# Patient Record
Sex: Male | Born: 1980 | Race: White | Hispanic: No | Marital: Single | Smoking: Never smoker
Health system: Southern US, Community
[De-identification: ages and names within clinical notes are randomized; demographics above are authoritative.]

## PROBLEM LIST (undated history)

## (undated) DIAGNOSIS — G40909 Epilepsy, unspecified, not intractable, without status epilepticus: Secondary | ICD-10-CM

## (undated) DIAGNOSIS — E232 Diabetes insipidus: Secondary | ICD-10-CM

## (undated) DIAGNOSIS — G809 Cerebral palsy, unspecified: Secondary | ICD-10-CM

## (undated) HISTORY — PX: HIP SURGERY: SHX245

## (undated) HISTORY — PX: BACK SURGERY: SHX140

---

## 2018-01-24 ENCOUNTER — Encounter (HOSPITAL_COMMUNITY): Payer: Self-pay | Admitting: Emergency Medicine

## 2018-01-24 ENCOUNTER — Other Ambulatory Visit: Payer: Self-pay

## 2018-01-24 ENCOUNTER — Emergency Department (HOSPITAL_COMMUNITY): Payer: Medicaid Other

## 2018-01-24 ENCOUNTER — Inpatient Hospital Stay (HOSPITAL_COMMUNITY)
Admission: EM | Admit: 2018-01-24 | Discharge: 2018-01-28 | DRG: 871 | Disposition: A | Discharge status: Home Health | Payer: Medicaid Other | Attending: Family Medicine | Admitting: Family Medicine

## 2018-01-24 DIAGNOSIS — M549 Dorsalgia, unspecified: Secondary | ICD-10-CM | POA: Diagnosis present

## 2018-01-24 DIAGNOSIS — G40409 Other generalized epilepsy and epileptic syndromes, not intractable, without status epilepticus: Secondary | ICD-10-CM

## 2018-01-24 DIAGNOSIS — G40909 Epilepsy, unspecified, not intractable, without status epilepticus: Secondary | ICD-10-CM | POA: Diagnosis present

## 2018-01-24 DIAGNOSIS — E872 Acidosis: Secondary | ICD-10-CM | POA: Diagnosis present

## 2018-01-24 DIAGNOSIS — B9620 Unspecified Escherichia coli [E. coli] as the cause of diseases classified elsewhere: Secondary | ICD-10-CM | POA: Diagnosis present

## 2018-01-24 DIAGNOSIS — M25551 Pain in right hip: Secondary | ICD-10-CM | POA: Diagnosis not present

## 2018-01-24 DIAGNOSIS — K5900 Constipation, unspecified: Secondary | ICD-10-CM | POA: Diagnosis present

## 2018-01-24 DIAGNOSIS — N10 Acute tubulo-interstitial nephritis: Secondary | ICD-10-CM | POA: Diagnosis present

## 2018-01-24 DIAGNOSIS — Z79899 Other long term (current) drug therapy: Secondary | ICD-10-CM

## 2018-01-24 DIAGNOSIS — A419 Sepsis, unspecified organism: Principal | ICD-10-CM | POA: Diagnosis present

## 2018-01-24 DIAGNOSIS — N251 Nephrogenic diabetes insipidus: Secondary | ICD-10-CM | POA: Diagnosis present

## 2018-01-24 DIAGNOSIS — M25552 Pain in left hip: Secondary | ICD-10-CM | POA: Diagnosis not present

## 2018-01-24 DIAGNOSIS — N3000 Acute cystitis without hematuria: Secondary | ICD-10-CM | POA: Diagnosis not present

## 2018-01-24 DIAGNOSIS — J181 Lobar pneumonia, unspecified organism: Secondary | ICD-10-CM | POA: Diagnosis present

## 2018-01-24 DIAGNOSIS — M419 Scoliosis, unspecified: Secondary | ICD-10-CM | POA: Diagnosis present

## 2018-01-24 DIAGNOSIS — Z993 Dependence on wheelchair: Secondary | ICD-10-CM | POA: Diagnosis not present

## 2018-01-24 DIAGNOSIS — Z885 Allergy status to narcotic agent status: Secondary | ICD-10-CM | POA: Diagnosis not present

## 2018-01-24 DIAGNOSIS — N390 Urinary tract infection, site not specified: Secondary | ICD-10-CM | POA: Diagnosis present

## 2018-01-24 DIAGNOSIS — R6521 Severe sepsis with septic shock: Secondary | ICD-10-CM | POA: Diagnosis present

## 2018-01-24 DIAGNOSIS — G800 Spastic quadriplegic cerebral palsy: Secondary | ICD-10-CM | POA: Diagnosis present

## 2018-01-24 DIAGNOSIS — D509 Iron deficiency anemia, unspecified: Secondary | ICD-10-CM | POA: Diagnosis present

## 2018-01-24 DIAGNOSIS — E876 Hypokalemia: Secondary | ICD-10-CM | POA: Diagnosis present

## 2018-01-24 DIAGNOSIS — Z888 Allergy status to other drugs, medicaments and biological substances status: Secondary | ICD-10-CM

## 2018-01-24 DIAGNOSIS — J189 Pneumonia, unspecified organism: Secondary | ICD-10-CM

## 2018-01-24 DIAGNOSIS — R7881 Bacteremia: Secondary | ICD-10-CM

## 2018-01-24 DIAGNOSIS — R509 Fever, unspecified: Secondary | ICD-10-CM | POA: Diagnosis not present

## 2018-01-24 HISTORY — DX: Cerebral palsy, unspecified: G80.9

## 2018-01-24 HISTORY — DX: Epilepsy, unspecified, not intractable, without status epilepticus: G40.909

## 2018-01-24 HISTORY — DX: Diabetes insipidus: E23.2

## 2018-01-24 LAB — URINALYSIS, ROUTINE W REFLEX MICROSCOPIC
Bilirubin Urine: NEGATIVE
Glucose, UA: NEGATIVE mg/dL
KETONES UR: 5 mg/dL — AB
Nitrite: NEGATIVE
PH: 5 (ref 5.0–8.0)
PROTEIN: 100 mg/dL — AB
Specific Gravity, Urine: 1.02 (ref 1.005–1.030)

## 2018-01-24 LAB — COMPREHENSIVE METABOLIC PANEL
ALBUMIN: 3.5 g/dL (ref 3.5–5.0)
ALK PHOS: 59 U/L (ref 38–126)
ALT: 19 U/L (ref 0–44)
ANION GAP: 11 (ref 5–15)
AST: 26 U/L (ref 15–41)
BUN: 8 mg/dL (ref 6–20)
CO2: 25 mmol/L (ref 22–32)
Calcium: 9.5 mg/dL (ref 8.9–10.3)
Chloride: 98 mmol/L (ref 98–111)
Creatinine, Ser: 0.86 mg/dL (ref 0.61–1.24)
GFR calc Af Amer: >60 mL/min (ref >60)
GFR calc non Af Amer: >60 mL/min (ref >60)
GLUCOSE: 131 mg/dL — AB (ref 70–99)
Potassium: 3.8 mmol/L (ref 3.5–5.1)
SODIUM: 134 mmol/L — AB (ref 135–145)
Total Bilirubin: 1 mg/dL (ref 0.3–1.2)
Total Protein: 7.2 g/dL (ref 6.5–8.1)

## 2018-01-24 LAB — CBC WITH DIFFERENTIAL/PLATELET
BASOS PCT: 0 %
Basophils Absolute: 0 10*3/uL (ref 0.0–0.1)
Eosinophils Absolute: 0 10*3/uL (ref 0.0–0.5)
Eosinophils Relative: 0 %
HCT: 47.8 % (ref 39.0–52.0)
Hemoglobin: 15.9 g/dL (ref 13.0–17.0)
Lymphocytes Relative: 4 %
Lymphs Abs: 0.8 10*3/uL (ref 0.7–4.0)
MCH: 31.7 pg (ref 26.0–34.0)
MCHC: 33.3 g/dL (ref 30.0–36.0)
MCV: 95.4 fL (ref 80.0–100.0)
MONO ABS: 0.8 10*3/uL (ref 0.1–1.0)
Monocytes Relative: 4 %
NEUTROS ABS: 18.7 10*3/uL — AB (ref 1.7–7.7)
NEUTROS PCT: 92 %
NRBC: 0 /100{WBCs}
PLATELETS: 146 10*3/uL — AB (ref 150–400)
RBC: 5.01 MIL/uL (ref 4.22–5.81)
RDW: 13.6 % (ref 11.5–15.5)
WBC: 20.3 10*3/uL — AB (ref 4.0–10.5)
nRBC: 0 % (ref 0.0–0.2)

## 2018-01-24 LAB — I-STAT CG4 LACTIC ACID, ED
LACTIC ACID, VENOUS: 2.13 mmol/L — AB (ref 0.5–1.9)
Lactic Acid, Venous: 3.69 mmol/L (ref 0.5–1.9)

## 2018-01-24 LAB — I-STAT CHEM 8, ED
BUN: 9 mg/dL (ref 6–20)
Calcium, Ion: 1.13 mmol/L — ABNORMAL LOW (ref 1.15–1.40)
Chloride: 98 mmol/L (ref 98–111)
Creatinine, Ser: 0.8 mg/dL (ref 0.61–1.24)
Glucose, Bld: 131 mg/dL — ABNORMAL HIGH (ref 70–99)
HCT: 49 % (ref 39.0–52.0)
HEMOGLOBIN: 16.7 g/dL (ref 13.0–17.0)
POTASSIUM: 3.9 mmol/L (ref 3.5–5.1)
SODIUM: 136 mmol/L (ref 135–145)
TCO2: 32 mmol/L (ref 22–32)

## 2018-01-24 LAB — I-STAT VENOUS BLOOD GAS, ED
Acid-Base Excess: 3 mmol/L — ABNORMAL HIGH (ref 0.0–2.0)
BICARBONATE: 27.7 mmol/L (ref 20.0–28.0)
O2 Saturation: 79 %
PCO2 VEN: 41.9 mmHg — AB (ref 44.0–60.0)
PH VEN: 7.428 (ref 7.250–7.430)
PO2 VEN: 42 mmHg (ref 32.0–45.0)
TCO2: 29 mmol/L (ref 22–32)

## 2018-01-24 LAB — PROTIME-INR
INR: 1.18
Prothrombin Time: 14.9 seconds (ref 11.4–15.2)

## 2018-01-24 MED ORDER — LEVETIRACETAM ER 500 MG PO TB24: 500.0000 mg | ORAL_TABLET | Freq: Every day | ORAL | Status: DC

## 2018-01-24 MED ORDER — POLYETHYLENE GLYCOL 3350 17 G PO PACK: 17.0000 g | PACK | Freq: Every day | ORAL | Status: DC

## 2018-01-24 MED ORDER — LEVETIRACETAM ER 500 MG PO TB24
500.0000 mg | ORAL_TABLET | Freq: Two times a day (BID) | ORAL | Status: DC
  Filled 2018-01-24: qty 1

## 2018-01-24 MED ORDER — BISACODYL 10 MG RE SUPP: 10.0000 mg | Freq: Every day | RECTAL | Status: DC | PRN

## 2018-01-24 MED ORDER — SODIUM CHLORIDE 0.9 % IV SOLN
500.0000 mg | Freq: Once | INTRAVENOUS | Status: AC
  Administered 2018-01-24: 500 mg via INTRAVENOUS
  Filled 2018-01-24: qty 500

## 2018-01-24 MED ORDER — DIAZEPAM 5 MG PO TABS
5.0000 mg | ORAL_TABLET | Freq: Every day | ORAL | Status: DC | PRN
  Administered 2018-01-25 – 2018-01-27 (×2): 5 mg via ORAL
  Filled 2018-01-24 (×2): qty 1

## 2018-01-24 MED ORDER — LACTATED RINGERS IV SOLN
INTRAVENOUS | Status: DC
  Administered 2018-01-24 – 2018-01-28 (×5): via INTRAVENOUS

## 2018-01-24 MED ORDER — ACETAMINOPHEN 325 MG PO TABS
650.0000 mg | ORAL_TABLET | Freq: Once | ORAL | Status: AC
  Administered 2018-01-24: 650 mg via ORAL
  Filled 2018-01-24: qty 2

## 2018-01-24 MED ORDER — KETOROLAC TROMETHAMINE 15 MG/ML IJ SOLN
15.0000 mg | Freq: Once | INTRAMUSCULAR | Status: AC
  Administered 2018-01-24: 15 mg via INTRAVENOUS
  Filled 2018-01-24: qty 1

## 2018-01-24 MED ORDER — SODIUM CHLORIDE 0.9 % IV SOLN
1.0000 g | Freq: Once | INTRAVENOUS | Status: AC
  Administered 2018-01-24: 1 g via INTRAVENOUS
  Filled 2018-01-24: qty 10

## 2018-01-24 MED ORDER — IOHEXOL 300 MG/ML  SOLN
80.0000 mL | Freq: Once | INTRAMUSCULAR | Status: AC | PRN
  Administered 2018-01-24: 100 mL via INTRAVENOUS

## 2018-01-24 MED ORDER — SODIUM CHLORIDE 0.9 % IV BOLUS
1000.0000 mL | Freq: Once | INTRAVENOUS | Status: AC
  Administered 2018-01-24: 1000 mL via INTRAVENOUS

## 2018-01-24 MED ORDER — FLEET ENEMA 7-19 GM/118ML RE ENEM
1.0000 | ENEMA | Freq: Once | RECTAL | Status: DC
  Filled 2018-01-24: qty 1

## 2018-01-24 MED ORDER — DIVALPROEX SODIUM ER 500 MG PO TB24
1500.0000 mg | ORAL_TABLET | Freq: Every day | ORAL | Status: DC
  Administered 2018-01-25 – 2018-01-27 (×4): 1500 mg via ORAL
  Filled 2018-01-24 (×5): qty 3

## 2018-01-24 MED ORDER — LEVETIRACETAM ER 500 MG PO TB24
3000.0000 mg | ORAL_TABLET | Freq: Every day | ORAL | Status: DC
  Administered 2018-01-25 – 2018-01-27 (×4): 3000 mg via ORAL
  Filled 2018-01-24 (×4): qty 6

## 2018-01-24 NOTE — ED Triage Notes (Signed)
Pt has cerebral palsy.  Recently moved here from [facility]- PCP is [facility] in [place]..  Mom reports fever since yesterday (Tmax 101.8 axillary just PTA), congestion today, and constipation x 3 days.  Also reports foul smelling urine. Mom administered Tylenol with codeine PTA.

## 2018-01-24 NOTE — H&P (Addendum)
Family Medicine Teaching [facility] Admission History and Physical Service Pager: [phone]  Patient name: Jacob Wilkerson Medical record number: [id number] Date of birth: 1980/07/08 Age: 37 y.o. Gender: male  Primary Care Provider: Lazarus Gowda, MD Consultants: None Code Status: Full   Chief Complaint: Fever  Assessment and Plan: Jacob Wilkerson is a 37 y.o. male presenting with fever, cough, and UTI. 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   Sepsis 2/2 UTI, Pneumonia With 2 day h/o of fever and increased urinary frequency. Presented meeting sepsis shock criteria with tachycardia to 150s, fever 100.6F, RR 27, lactic acidosis 3.69, and hypotension with fluid resuscitation. He is s/p 2L NS bolus in the ED with some response in BP, 95/70 at the time of exam, however DP pulses appreciated on exam with good cap refill. Last PCP note recorded normal vitals (118/78, 76 HR). U/A notable for large leuks, large Hb, many bacteria, negative nitrites. Blood and urine cultures obtained in the ED prior to starting CTX. WBC 20.3. No hydronephrosis seen on CT. Patient also with cough since this morning. CXR obtained in the ED with mild vascular congestion however without focal infiltrates. R hemidiaphragm also elevated, question atelectasis given rhonchorous sounds heard on exam in R middle and lower lobes. No prior CXR for comparison. CT Abd/Pelvis showed RLL consolidation concerning for pneumonia as well as ground glass densities in the LLL. Started on azithromycin in addition to CTX. Has previously received pneumococcal vaccine per last PCP note. - admit to med-surg, attending Dr. Jennette Kettle - continue CTX and azithromycin (10/20 - ) - am CBC, BMP - strict I&Os - f/u urine, blood cx - vital signs per unit routine - trend LA  Constipation  Abdominal Pain Has tried glycerin suppository and fleet enema at home yesterday without  success. Mom reports h/o hard stools with BM occurring ~2 days. CT Abd/Pelvis obtained showing large stool burden in the colon concerning for impaction with gaseous distention without evidence of obstruction. Manual disimpaction attempted at home as well as in the ED with minimal success. Enema ordered. No prior abdominal surgeries to concern for adhesions.  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 Takes Valium 5mg  once Jacob PRN for muscle spasms.  - continue PRN Valium  Nephrogenic Diabetes insipidus Na 134 on admission. S/p 2L NS bolus with increase in Na to 136. Continued on LR for maintenance IVF. Given propensity for hypernatremia, will obtain repeat BMP to reassess need for change in IVF. - repeat BMP, consider switching to 1/4NS if needed - continue LR mIVF - am BMP  Epilepsy  Followed by [facility] neurology, Dr. Malvin Johns. H/o seizures since birth, home depakote ER 1500mg  and keppra XR 3000mg  nightly. EEG recently obtained 10/11, results not viewable. Last seizure was last week per mom due to medication changes after frequent seizures. She states he has not had any seizures since switching back to XR formulation of Keppra. - continue home Keppra and Depakote - seizure precautions  Back pain Severe thoracolumbar scoliosis with h/o several back surgeries. Takes Tylenol #3 TID PRN for pain - hold home Tylenol #3  FEN/GI: Heart Healthy Prophylaxis: lovenox  Disposition: admit to med-surg, attending Dr. Jennette Kettle  History of Present Illness:  Jacob Wilkerson is a 37 y.o. male presenting with fever.   History obtained from  patient's mother who was present at bedside.   Per patient's mother patient has been "sick" a couple of days now. Patient began running fever yesterday with Tmax 101, but fever broke this morning. Patient then began spiking  another fever this afternoon with Tmax today 102.8. Along with fever, patient began having foul smelling urine yesterday evening. Patient does not seem to be in pain during urination. Mother notes volume of urination has increased. Patient also with cough beginning this morning. Father was concerned he had pneumonia. No recent wounds. No sick contacts. Does live with his sister and their kids since moving from [place] in May.  Patient also with 3-4 day history of constipation. Normally has BM every 2 days. At home patient takes Mg with drinks and has regular BMs. Has not had enema in years. Mother tried glycerin suppository with fleet enema yesterday and oral Mg with no BM at home.  No leakage of fluid from rectum. Patient has always had issues with bowels but no h/o obstruction or perforation. No blood in urine or stool. No abdominal surgeries.  H/o CP, nonverbal at baseline with mental deficiencies. Has had several back surgeries. Recently moved from [place] state in May. Mother concerned that maybe climate change has caused illness. Patient has been doing well since move.   Has nephrogenic diabetes insipidus. Normally has elevated sodium. Recent change in diet has limited sodium intake. Mom states he is not supposed to have normal saline with IV fluids.   H/o respiratory distress when under anesthesia.   Review Of Systems: Per HPI with the following additions:  ROS from patient's mother  Review of Systems  Constitutional: Positive for fever. Negative for weight loss.  Respiratory: Positive for cough.   Gastrointestinal: Positive for constipation.  Genitourinary: Negative for dysuria, frequency and hematuria.       Urinary odor Increased urine volume   Patient Active Problem List   Diagnosis Date Noted  . Urinary tract infection 01/24/2018   Past Medical History: Past Medical History:  Diagnosis Date  . Cerebral palsy (HCC)   . Diabetes insipidus (HCC)   . Epilepsy [facility])    Past  Surgical History: Past Surgical History:  Procedure Laterality Date  . BACK SURGERY    . HIP SURGERY     Social History: Social History   Tobacco Use  . Smoking status: Never Smoker  . Smokeless tobacco: Never Used  Substance Use Topics  . Alcohol use: Never    Frequency: Never  . Drug use: Never   Additional social history: lives with mother, father, sister, and sister's children   Please also refer to relevant sections of EMR.  Family History: No family history on file. Mother - asthma Sister - asthma Nephew - asthma  Unknown biological father history  Maternal grandmother - stroke   Allergies and Medications: Allergies  Allergen Reactions  . Morphine Anaphylaxis  . Topiramate Other (See Comments)    Got into a coma With valium causes and overdose. And coma   . Orange (Diagnostic) Nausea And Vomiting  . Tramadol Other (See Comments)    Can't take due to his seizures SEIZURE DISORDER CAN NOT TAKE PER NEUROLOGIST.     No current facility-administered medications on file prior to encounter.    Current Outpatient Medications on File Prior to Encounter  Medication Sig Dispense Refill  . acetaminophen-codeine (TYLENOL #3) 300-30 MG tablet Take 1 tablet by mouth every 6 (six) hours as needed. for pain  0  . diazepam (VALIUM)  5 MG tablet Take 5 mg by mouth every 12 (twelve) hours as needed. for anxiety  0  . divalproex (DEPAKOTE ER) 500 MG 24 hr tablet Take 1,500 mg by mouth at bedtime.    . Levetiracetam 750 MG TB24 Take 3,000 mg by mouth at bedtime.  2    Objective: BP (!) 94/58   Pulse (!) 110   Temp (!) 100.5 F (38.1 C) (Axillary)   Resp 19   Ht 5' (1.524 m)   Wt 47.8 kg   SpO2 96%   BMI 20.58 kg/m  Exam: General: chronically ill male lying in bed, intermittent coughing on exam, in NAD Eyes: PERRL, EOMI ENTM: poor dentition, MMM Neck: no cervical lymphadenopathy Cardiovascular: distant heart sounds, tachycardic. DP pulses appreciated Respiratory:  CTAB, rhonchorous sounds appreciated R middle to lower lobes. Normal WOB on RA, appropriately saturated. Gastrointestinal: chronic spasticity noted although soft to palpation. No grimace noted on palpation. No hepatosplenomegaly. +BS in all 4 quadrants. MSK: chronic spasticity in all 4 extremities Derm: no lesions or wounds identified Neuro: non-verbal at baseline, orients to sound. Per mom is at neurological baseline.  Labs and Imaging: CBC BMET  Recent Labs  Lab 01/24/18 1841 01/24/18 1900  WBC 20.3*  --   HGB 15.9 16.7  HCT 47.8 49.0  PLT 146*  --    Recent Labs  Lab 01/24/18 1841 01/24/18 1900  NA 134* 136  K 3.8 3.9  CL 98 98  CO2 25  --   BUN 8 9  CREATININE 0.86 0.80  GLUCOSE 131* 131*  CALCIUM 9.5  --       Ref. Range 01/24/2018 19:00 01/24/2018 20:48  Lactic Acid, Venous Latest Ref Range: 0.5 - 1.9 mmol/L 3.69 (HH) 2.13 (HH)    Ref. Range 01/24/2018 19:02  Sample type Unknown VENOUS  pH, Ven Latest Ref Range: 7.250 - 7.430  7.428  pCO2, Ven Latest Ref Range: 44.0 - 60.0 mmHg 41.9 (L)  pO2, Ven Latest Ref Range: 32.0 - 45.0 mmHg 42.0  TCO2 Latest Ref Range: 22 - 32 mmol/L 29  Acid-Base Excess Latest Ref Range: 0.0 - 2.0 mmol/L 3.0 (H)  Bicarbonate Latest Ref Range: 20.0 - 28.0 mmol/L 27.7  O2 Saturation Latest Units: % 79.0   Urinalysis    Component Value Date/Time   COLORURINE AMBER (A) 01/24/2018 1912   APPEARANCEUR CLOUDY (A) 01/24/2018 1912   LABSPEC 1.020 01/24/2018 1912   PHURINE 5.0 01/24/2018 1912   GLUCOSEU NEGATIVE 01/24/2018 1912   HGBUR LARGE (A) 01/24/2018 1912   BILIRUBINUR NEGATIVE 01/24/2018 1912   KETONESUR 5 (A) 01/24/2018 1912   PROTEINUR 100 (A) 01/24/2018 1912   NITRITE NEGATIVE 01/24/2018 1912   LEUKOCYTESUR LARGE (A) 01/24/2018 1912    Ref. Range 01/24/2018 19:12  Bacteria, UA Latest Ref Range: NONE SEEN  MANY (A)  Mucus Unknown PRESENT  RBC / HPF Latest Ref Range: 0 - 5 RBC/hpf 6-10  Squamous Epithelial / LPF Latest Ref  Range: 0 - 5  0-5  WBC Clumps Unknown PRESENT  WBC, UA Latest Ref Range: 0 - 5 WBC/hpf >50 (H)    Ct Abdomen Pelvis W Contrast  Result Date: 01/24/2018 CLINICAL DATA:  Lower abdominal pain. EXAM: CT ABDOMEN AND PELVIS WITH CONTRAST TECHNIQUE: Multidetector CT imaging of the abdomen and pelvis was performed using the standard protocol following bolus administration of intravenous contrast. CONTRAST:  OMNIPAQUE IOHEXOL 300 MG/ML  SOLN COMPARISON:  None. FINDINGS: Lower chest: Consolidation in the right lower lobe concerning  for pneumonia. Patchy ground-glass opacities in the left lower lobe could also reflect pneumonia. No effusions. Hepatobiliary: No focal hepatic abnormality. Gallbladder unremarkable. Pancreas: No focal abnormality or ductal dilatation. Spleen: No focal abnormality.  Normal size. Adrenals/Urinary Tract: Small cyst in the midpole of the left kidney and upper pole of the left kidney. No hydronephrosis. No adrenal mass. Urinary bladder decompressed and difficult to evaluate. Stomach/Bowel: Large stool burden in the rectosigmoid colon concerning for fecal impaction. Large stool burden throughout the remainder of the colon. Gas throughout stomach, large and small bowel. No convincing evidence for bowel obstruction. Vascular/Lymphatic: No evidence of aneurysm or adenopathy. Reproductive: No visible focal abnormality. Other: No free fluid or free air. Musculoskeletal: Severe thoracolumbar scoliosis with posterior spinal rods in place. No acute bony abnormality. IMPRESSION: Airspace disease in the posterior right lower lobe and to a lesser extent left lower lobe concerning for pneumonia. Large stool burden in the colon, particularly rectosigmoid colon concerning for fecal impaction. Mild gaseous distention of bowel diffusely without evidence of bowel obstruction. Severe thoracolumbar scoliosis. Electronically Signed   By: Charlett Nose M.D.   On: 01/24/2018 20:07   Dg Chest Port 1  View  Result Date: 01/24/2018 CLINICAL DATA:  Cough, hypoxia EXAM: PORTABLE CHEST 1 VIEW COMPARISON:  None FINDINGS: Elevation of the right hemidiaphragm. Mild vascular congestion. No confluent opacities or effusions. Heart is normal size. Posterior spinal hardware noted. Gaseous distention of upper abdominal bowel. IMPRESSION: Elevation of the right hemidiaphragm.  Mild vascular congestion. Electronically Signed   By: Charlett Nose M.D.   On: 01/24/2018 19:10   Ellwood Dense, DO 01/24/2018, 10:59 PM PGY-2, [facility] FPTS Intern pager: [phone], text pages welcome

## 2018-01-24 NOTE — ED Provider Notes (Signed)
[facility] EMERGENCY DEPARTMENT Provider Note   CSN: [id number] Arrival date & time: 01/24/18  1804     History   Chief Complaint Chief Complaint  Patient presents with  . Fever  . Constipation    HPI Dannis Deroche is a 37 y.o. male.  37 yo M with a cc of fever.  Going on for the past couple days.  Patient has a history of cerebral palsy and is normally fairly healthy according to the family.  Has had a mild cough and constipation.  No bowel movement in about 3 days.  They have tried milk of magnesia an enema  The history is provided by the patient.  Fever   This is a new problem. The current episode started 2 days ago. The problem occurs constantly. The problem has been gradually worsening. His temperature was unmeasured prior to arrival. Pertinent negatives include no chest pain, no diarrhea, no vomiting, no congestion and no headaches. He has tried nothing for the symptoms. The treatment provided no relief.  Constipation   Associated symptoms include abdominal pain.    Past Medical History:  Diagnosis Date  . Cerebral palsy (HCC)   . Diabetes insipidus (HCC)   . Epilepsy (HCC)     There are no active problems to display for this patient.   Past Surgical History:  Procedure Laterality Date  . BACK SURGERY    . HIP SURGERY          Home Medications    Prior to Admission medications   Not on File    Family History No family history on file.  Social History Social History   Tobacco Use  . Smoking status: Never Smoker  . Smokeless tobacco: Never Used  Substance Use Topics  . Alcohol use: Never    Frequency: Never  . Drug use: Never     Allergies   Patient has no allergy information on record.   Review of Systems Review of Systems  Constitutional: Negative for chills and fever.  HENT: Negative for congestion and facial swelling.   Eyes: Negative for discharge and visual disturbance.  Respiratory: Negative for shortness of  breath.   Cardiovascular: Negative for chest pain and palpitations.  Gastrointestinal: Positive for abdominal pain and constipation. Negative for diarrhea and vomiting.  Musculoskeletal: Negative for arthralgias and myalgias.  Skin: Negative for color change and rash.  Neurological: Negative for tremors, syncope and headaches.  Psychiatric/Behavioral: Negative for confusion and dysphoric mood.     Physical Exam Updated Vital Signs BP 95/65   Pulse (!) 101   Temp (!) 100.5 F (38.1 C) (Axillary)   Resp 16   Ht 5' (1.524 m)   Wt 47.8 kg   SpO2 99%   BMI 20.58 kg/m   Physical Exam  Constitutional: He is oriented to person, place, and time. He appears well-developed and well-nourished.  HENT:  Head: Normocephalic and atraumatic.  Eyes: Pupils are equal, round, and reactive to light. EOM are normal.  Neck: Normal range of motion. Neck supple. No JVD present.  Cardiovascular: Regular rhythm. Tachycardia present. Exam reveals no gallop and no friction rub.  No murmur heard. Pulmonary/Chest: No respiratory distress. He has no wheezes.  Abdominal: He exhibits no distension and no mass. There is tenderness. There is no rebound and no guarding.  Patient appears to grimace when I palpate his lower abdomen worst on the right side  Genitourinary:  Genitourinary Comments: Trace soft brown stool in the vault.  Musculoskeletal:  Contractures lower extremities.  Shortened right lower extremity.  Neurological: He is alert and oriented to person, place, and time.  Skin: No rash noted. No pallor.  Psychiatric: He has a normal mood and affect. His behavior is normal.  Nursing note and vitals reviewed.    ED Treatments / Results  Labs (all labs ordered are listed, but only abnormal results are displayed) Labs Reviewed  COMPREHENSIVE METABOLIC PANEL - Abnormal; Notable for the following components:      Result Value   Sodium 134 (*)    Glucose, Bld 131 (*)    All other components within  normal limits  CBC WITH DIFFERENTIAL/PLATELET - Abnormal; Notable for the following components:   WBC 20.3 (*)    Platelets 146 (*)    Neutro Abs 18.7 (*)    All other components within normal limits  URINALYSIS, ROUTINE W REFLEX MICROSCOPIC - Abnormal; Notable for the following components:   Color, Urine AMBER (*)    APPearance CLOUDY (*)    Hgb urine dipstick LARGE (*)    Ketones, ur 5 (*)    Protein, ur 100 (*)    Leukocytes, UA LARGE (*)    WBC, UA >50 (*)    Bacteria, UA MANY (*)    All other components within normal limits  I-STAT CG4 LACTIC ACID, ED - Abnormal; Notable for the following components:   Lactic Acid, Venous 3.69 (*)    All other components within normal limits  I-STAT CHEM 8, ED - Abnormal; Notable for the following components:   Glucose, Bld 131 (*)    Calcium, Ion 1.13 (*)    All other components within normal limits  I-STAT VENOUS BLOOD GAS, ED - Abnormal; Notable for the following components:   pCO2, Ven 41.9 (*)    Acid-Base Excess 3.0 (*)    All other components within normal limits  I-STAT CG4 LACTIC ACID, ED - Abnormal; Notable for the following components:   Lactic Acid, Venous 2.13 (*)    All other components within normal limits  CULTURE, BLOOD (ROUTINE X 2)  CULTURE, BLOOD (ROUTINE X 2)  PROTIME-INR    EKG EKG Interpretation  Date/Time:  Sunday January 24 2018 18:18:03 EDT Ventricular Rate:  151 PR Interval:  152 QRS Duration: 68 QT Interval:  230 QTC Calculation: 364 R Axis:   129 Text Interpretation:  Sinus tachycardia Anterolateral infarct , age undetermined Abnormal ECG st changes likely rate related Otherwise no significant change Confirmed by Melene Plan [phone]) on 01/24/2018 6:22:22 PM   Radiology Ct Abdomen Pelvis W Contrast  Result Date: 01/24/2018 CLINICAL DATA:  Lower abdominal pain. EXAM: CT ABDOMEN AND PELVIS WITH CONTRAST TECHNIQUE: Multidetector CT imaging of the abdomen and pelvis was performed using the standard  protocol following bolus administration of intravenous contrast. CONTRAST:  OMNIPAQUE IOHEXOL 300 MG/ML  SOLN COMPARISON:  None. FINDINGS: Lower chest: Consolidation in the right lower lobe concerning for pneumonia. Patchy ground-glass opacities in the left lower lobe could also reflect pneumonia. No effusions. Hepatobiliary: No focal hepatic abnormality. Gallbladder unremarkable. Pancreas: No focal abnormality or ductal dilatation. Spleen: No focal abnormality.  Normal size. Adrenals/Urinary Tract: Small cyst in the midpole of the left kidney and upper pole of the left kidney. No hydronephrosis. No adrenal mass. Urinary bladder decompressed and difficult to evaluate. Stomach/Bowel: Large stool burden in the rectosigmoid colon concerning for fecal impaction. Large stool burden throughout the remainder of the colon. Gas throughout stomach, large and small bowel. No convincing evidence for  bowel obstruction. Vascular/Lymphatic: No evidence of aneurysm or adenopathy. Reproductive: No visible focal abnormality. Other: No free fluid or free air. Musculoskeletal: Severe thoracolumbar scoliosis with posterior spinal rods in place. No acute bony abnormality. IMPRESSION: Airspace disease in the posterior right lower lobe and to a lesser extent left lower lobe concerning for pneumonia. Large stool burden in the colon, particularly rectosigmoid colon concerning for fecal impaction. Mild gaseous distention of bowel diffusely without evidence of bowel obstruction. Severe thoracolumbar scoliosis. Electronically Signed   By: Charlett Nose M.D.   On: 01/24/2018 20:07   Dg Chest Port 1 View  Result Date: 01/24/2018 CLINICAL DATA:  Cough, hypoxia EXAM: PORTABLE CHEST 1 VIEW COMPARISON:  None FINDINGS: Elevation of the right hemidiaphragm. Mild vascular congestion. No confluent opacities or effusions. Heart is normal size. Posterior spinal hardware noted. Gaseous distention of upper abdominal bowel. IMPRESSION: Elevation of  the right hemidiaphragm.  Mild vascular congestion. Electronically Signed   By: Charlett Nose M.D.   On: 01/24/2018 19:10    Procedures Procedures (including critical care time)  Medications Ordered in ED Medications  lactated ringers infusion ( Intravenous New Bag/Given 01/24/18 2045)  azithromycin (ZITHROMAX) 500 mg in sodium chloride 0.9 % 250 mL IVPB (500 mg Intravenous New Bag/Given 01/24/18 2057)  sodium phosphate (FLEET) 7-19 GM/118ML enema 1 enema (has no administration in time range)  sodium chloride 0.9 % bolus 1,000 mL (1,000 mLs Intravenous New Bag/Given 01/24/18 2045)  ketorolac (TORADOL) 15 MG/ML injection 15 mg (15 mg Intravenous Given 01/24/18 1852)  acetaminophen (TYLENOL) tablet 650 mg (650 mg Oral Given 01/24/18 1852)  sodium chloride 0.9 % bolus 1,000 mL (0 mLs Intravenous Stopped 01/24/18 2017)  iohexol (OMNIPAQUE) 300 MG/ML solution 80 mL (100 mLs Intravenous Contrast Given 01/24/18 1938)  cefTRIAXone (ROCEPHIN) 1 g in sodium chloride 0.9 % 100 mL IVPB (0 g Intravenous Stopped 01/24/18 2054)     Initial Impression / Assessment and Plan / ED Course  I have reviewed the triage vital signs and the nursing notes.  Pertinent labs & imaging results that were available during my care of the patient were reviewed by me and considered in my medical decision making (see chart for details).     37 yo M with a cc of a fever.  Going on for the past couple days. Patient non verbal.  Abdominal pain on my exam, will ct labs, ua, cxr.   HR significantly improved after IV fluids.  The patient's blood pressure trended down into the 70s for about 10 minutes and then came back up with more IV fluids.  Labs with a lactate of 3.7, sodium is mildly low at 134.  He has a white blood cell count of 20.3.  His urine was concerning for a urinary tract infection with large leuks and nitrate positive.  Started on Rocephin.  CT scan of the abdomen pelvis with large stool burden, appears to have a  large fecal impaction.  Clinically I am unable to reach the stool.  We will give an enema.  Will discuss with hospitalist for admission.  CRITICAL CARE Performed by: Rae Roam   Total critical care time: 80 minutes  Critical care time was exclusive of separately billable procedures and treating other patients.  Critical care was necessary to treat or prevent imminent or life-threatening deterioration.  Critical care was time spent personally by me on the following activities: development of treatment plan with patient and/or surrogate as well as nursing, discussions with consultants, evaluation of  patient's response to treatment, examination of patient, obtaining history from patient or surrogate, ordering and performing treatments and interventions, ordering and review of laboratory studies, ordering and review of radiographic studies, pulse oximetry and re-evaluation of patient's condition.   Final Clinical Impressions(s) / ED Diagnoses   Final diagnoses:  Septic shock (HCC)  Acute pyelonephritis  Community acquired pneumonia of left lower lobe of lung [facility])    ED Discharge Orders    None       Melene Plan, DO 01/24/18 2110

## 2018-01-24 NOTE — ED Notes (Signed)
MD made aware of BP

## 2018-01-24 NOTE — ED Notes (Signed)
No answer in waiting room 

## 2018-01-25 DIAGNOSIS — N10 Acute tubulo-interstitial nephritis: Secondary | ICD-10-CM

## 2018-01-25 DIAGNOSIS — K5900 Constipation, unspecified: Secondary | ICD-10-CM

## 2018-01-25 LAB — BASIC METABOLIC PANEL
Anion gap: 6 (ref 5–15)
BUN: 9 mg/dL (ref 6–20)
CALCIUM: 8.1 mg/dL — AB (ref 8.9–10.3)
CO2: 26 mmol/L (ref 22–32)
CREATININE: 0.7 mg/dL (ref 0.61–1.24)
Chloride: 103 mmol/L (ref 98–111)
GFR calc Af Amer: >60 mL/min (ref >60)
GLUCOSE: 112 mg/dL — AB (ref 70–99)
Potassium: 3.4 mmol/L — ABNORMAL LOW (ref 3.5–5.1)
Sodium: 135 mmol/L (ref 135–145)

## 2018-01-25 LAB — BLOOD CULTURE ID PANEL (REFLEXED)
Acinetobacter baumannii: NOT DETECTED
CANDIDA PARAPSILOSIS: NOT DETECTED
CANDIDA TROPICALIS: NOT DETECTED
CARBAPENEM RESISTANCE: NOT DETECTED
Candida albicans: NOT DETECTED
Candida glabrata: NOT DETECTED
Candida krusei: NOT DETECTED
ENTEROBACTER CLOACAE COMPLEX: NOT DETECTED
ENTEROBACTERIACEAE SPECIES: DETECTED — AB
Enterococcus species: NOT DETECTED
Escherichia coli: DETECTED — AB
HAEMOPHILUS INFLUENZAE: NOT DETECTED
KLEBSIELLA PNEUMONIAE: NOT DETECTED
Klebsiella oxytoca: NOT DETECTED
Listeria monocytogenes: NOT DETECTED
NEISSERIA MENINGITIDIS: NOT DETECTED
PROTEUS SPECIES: NOT DETECTED
Pseudomonas aeruginosa: NOT DETECTED
STAPHYLOCOCCUS AUREUS BCID: NOT DETECTED
STREPTOCOCCUS SPECIES: NOT DETECTED
Serratia marcescens: NOT DETECTED
Staphylococcus species: NOT DETECTED
Streptococcus agalactiae: NOT DETECTED
Streptococcus pneumoniae: NOT DETECTED
Streptococcus pyogenes: NOT DETECTED

## 2018-01-25 LAB — LACTIC ACID, PLASMA
LACTIC ACID, VENOUS: 1.9 mmol/L (ref 0.5–1.9)
LACTIC ACID, VENOUS: 1.8 mmol/L (ref 0.5–1.9)
Lactic Acid, Venous: 2.3 mmol/L (ref 0.5–1.9)

## 2018-01-25 LAB — CBC
HEMATOCRIT: 34.9 % — AB (ref 39.0–52.0)
Hemoglobin: 11.6 g/dL — ABNORMAL LOW (ref 13.0–17.0)
MCH: 31.8 pg (ref 26.0–34.0)
MCHC: 33.2 g/dL (ref 30.0–36.0)
MCV: 95.6 fL (ref 80.0–100.0)
PLATELETS: 119 10*3/uL — AB (ref 150–400)
RBC: 3.65 MIL/uL — ABNORMAL LOW (ref 4.22–5.81)
RDW: 13.5 % (ref 11.5–15.5)
WBC: 14.9 10*3/uL — ABNORMAL HIGH (ref 4.0–10.5)
nRBC: 0 % (ref 0.0–0.2)

## 2018-01-25 LAB — HIV ANTIBODY (ROUTINE TESTING W REFLEX): HIV Screen 4th Generation wRfx: NONREACTIVE

## 2018-01-25 MED ORDER — ACETAMINOPHEN-CODEINE #3 300-30 MG PO TABS
1.0000 | ORAL_TABLET | Freq: Four times a day (QID) | ORAL | Status: DC | PRN
  Administered 2018-01-25 – 2018-01-27 (×4): 1 via ORAL
  Filled 2018-01-25 (×5): qty 1

## 2018-01-25 MED ORDER — POLYETHYLENE GLYCOL 3350 17 G PO PACK
17.0000 g | PACK | Freq: Two times a day (BID) | ORAL | Status: DC
  Administered 2018-01-25 – 2018-01-28 (×7): 17 g via ORAL
  Filled 2018-01-25 (×8): qty 1

## 2018-01-25 MED ORDER — SORBITOL 70 % SOLN
960.0000 mL | TOPICAL_OIL | Freq: Once | ORAL | Status: DC
  Filled 2018-01-25: qty 473

## 2018-01-25 MED ORDER — POTASSIUM CHLORIDE CRYS ER 20 MEQ PO TBCR
20.0000 meq | EXTENDED_RELEASE_TABLET | Freq: Once | ORAL | Status: AC
  Administered 2018-01-25: 20 meq via ORAL
  Filled 2018-01-25: qty 1

## 2018-01-25 MED ORDER — SODIUM CHLORIDE 0.9 % IV SOLN
2.0000 g | INTRAVENOUS | Status: DC
  Administered 2018-01-25 – 2018-01-27 (×3): 2 g via INTRAVENOUS
  Filled 2018-01-25 (×3): qty 20

## 2018-01-25 MED ORDER — ENOXAPARIN SODIUM 40 MG/0.4ML ~~LOC~~ SOLN
40.0000 mg | SUBCUTANEOUS | Status: DC
  Administered 2018-01-25 – 2018-01-28 (×4): 40 mg via SUBCUTANEOUS
  Filled 2018-01-25 (×4): qty 0.4

## 2018-01-25 MED ORDER — BISACODYL 10 MG RE SUPP
10.0000 mg | Freq: Every day | RECTAL | Status: DC
  Administered 2018-01-25: 10 mg via RECTAL
  Filled 2018-01-25 (×3): qty 1

## 2018-01-25 MED ORDER — LACTATED RINGERS IV BOLUS
1000.0000 mL | Freq: Once | INTRAVENOUS | Status: AC
  Administered 2018-01-25: 1000 mL via INTRAVENOUS

## 2018-01-25 NOTE — Progress Notes (Signed)
FPTS Interim Progress Note  Received a page from nurse. Parents are concerned about urine output. He seems to not be making much urine. Also concerned about his abdominal distention and abdominal pain. Patient received miralax and enema yesterday with 4 bowel movements today, last BM at 5pm today.   Informed nurse to hold SMOG enema for now as patient is responding well to current treatment. Will consider giving SMOG if no further BM's. Informed family that it isn't surprising he still has abdominal pain due to amount of stool present on CT. Will expect this to improve as he has more BM's.   Nurse notes urine is still amber in color. No urine output has been recorded all day. Nurse is going to empty bag and perform an in-and-out cath and record urine. Will also do urine culture at this time.   Jacob Cobb [place], DO 01/25/2018, 5:59 PM PGY-1, [facility] Family Medicine Service pager [phone]

## 2018-01-25 NOTE — Progress Notes (Signed)
CRITICAL VALUE ALERT  Critical Value:  Lactic acid 2.3  Date & Time Notied:  01/25/18 1610  Provider Notified: Oralia Manis, DO  Orders Received/Actions taken: Yes

## 2018-01-25 NOTE — Care Management Note (Signed)
Case Management Note  Patient Details  Name: Jacob Wilkerson MRN: [id number] Date of Birth: 1980/06/27  Subjective/Objective:                    Action/Plan: HX: cerebral palsy.  Recently moved here from [facility]- PCP is [facility] in [place]. Will follow for discharge needs.  Expected Discharge Date:                  Expected Discharge Plan:  Home w Home Health Services  In-House Referral:     Discharge planning Services  CM Consult  Post Acute Care Choice:  Home Health Choice offered to:     DME Arranged:    DME Agency:     HH Arranged:    HH Agency:     Status of Service:  In process, will continue to follow  If discussed at Long Length of Stay Meetings, dates discussed:    Additional Comments:  Kingsley Plan, RN 01/25/2018, 12:15 PM

## 2018-01-25 NOTE — Progress Notes (Signed)
PHARMACY - PHYSICIAN COMMUNICATION CRITICAL VALUE ALERT - BLOOD CULTURE IDENTIFICATION (BCID)  Jacob Wilkerson is an 37 y.o. male who presented to [facility] on 01/24/2018 with a chief complaint of fever  Assessment:  Blood cx shows GNRs, BCID is E. Coli. Tmax of 100.5, WBC 14.9.  Name of physician (or Provider) Contacted: FMTS  Current antibiotics:   Ceftriaxone and Azithromycin in ED overnight  Changes to prescribed antibiotics recommended:  Start ceftriaxone 2g IV Q24h  Results for orders placed or performed during the hospital encounter of 01/24/18  Blood Culture ID Panel (Reflexed) (Collected: 01/24/2018  6:41 PM)  Result Value Ref Range   Enterococcus species NOT DETECTED NOT DETECTED   Listeria monocytogenes NOT DETECTED NOT DETECTED   Staphylococcus species NOT DETECTED NOT DETECTED   Staphylococcus aureus (BCID) NOT DETECTED NOT DETECTED   Streptococcus species NOT DETECTED NOT DETECTED   Streptococcus agalactiae NOT DETECTED NOT DETECTED   Streptococcus pneumoniae NOT DETECTED NOT DETECTED   Streptococcus pyogenes NOT DETECTED NOT DETECTED   Acinetobacter baumannii NOT DETECTED NOT DETECTED   Enterobacteriaceae species DETECTED (A) NOT DETECTED   Enterobacter cloacae complex NOT DETECTED NOT DETECTED   Escherichia coli DETECTED (A) NOT DETECTED   Klebsiella oxytoca NOT DETECTED NOT DETECTED   Klebsiella pneumoniae NOT DETECTED NOT DETECTED   Proteus species NOT DETECTED NOT DETECTED   Serratia marcescens NOT DETECTED NOT DETECTED   Carbapenem resistance NOT DETECTED NOT DETECTED   Haemophilus influenzae NOT DETECTED NOT DETECTED   Neisseria meningitidis NOT DETECTED NOT DETECTED   Pseudomonas aeruginosa NOT DETECTED NOT DETECTED   Candida albicans NOT DETECTED NOT DETECTED   Candida glabrata NOT DETECTED NOT DETECTED   Candida krusei NOT DETECTED NOT DETECTED   Candida parapsilosis NOT DETECTED NOT DETECTED   Candida tropicalis NOT DETECTED NOT DETECTED     [name] 01/25/2018  11:57 AM

## 2018-01-25 NOTE — Progress Notes (Addendum)
Pt arrived to unit. Pt alert, Oriented Mother to unit/call bell system. Notified physician of 2.3 lactic acid.

## 2018-01-25 NOTE — Progress Notes (Addendum)
Nutrition Brief Note  Patient identified on the Low [place] (<12) Report  Wt Readings from Last 15 Encounters:  01/24/18 47.8 kg   Jacob Wilkerson is a 37 y.o. male presenting with fever, cough, and UTI. 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   Pt admitted with sepsis secondary to UTI, pneumonia.   Pt resting quietly at time of visit. No supports at bedside.   Reviewed wt hx from Care Everywhere, which reveals wt stability.   Body mass index is 20.58 kg/m. Patient meets criteria for normal weight range based on current BMI.   Current diet order is Heart Healthy, patient is consuming approximately n/a% of meals at this time. Labs and medications reviewed.   No nutrition interventions warranted at this time. If nutrition issues arise, please consult RD.   Carlena Ruybal A. Mayford Knife, RD, LDN, CDE Pager: [phone] After hours Pager: [phone]

## 2018-01-25 NOTE — Progress Notes (Signed)
Order for non violent restraints (hand mittens) ordered. Mittens provided. Patient did not show any signs of irritation warranting use, will continue to monitor.

## 2018-01-25 NOTE — Progress Notes (Signed)
Family Medicine Teaching Service Daily Progress Note Intern Pager: [phone]  Patient name: Jacob Wilkerson Medical record number: [id number] Date of birth: 1981/03/26 Age: 37 y.o. Gender: male  Primary Care Provider: Lazarus Gowda, MD Consultants: none Code Status: full  Pt Overview and Major Events to Date:  10/20 admitted  Assessment and Plan:  Fever unknown source- patient remains afebrile today. He does remain tachycardic with HR max 127. LA within normal limits. WBC 14.9 today which improved from 20.3 yesterday. Patient appears to be resting comfortably.   - continue CTX and azithromycin (10/20 - ) - am CBC, BMP - strict I&Os - f/u urine, blood cx - vital signs per unit routine - trend LA - tylenol PRN for pain  UTI- U/A notable for large leuks, large Hb, many bacteria, negative nitrites. urine continues to look amber and have foul smell. - ordered urine culture - continue CTX (10/21-) - bmp am - monitor I/O  Pneumonia- CXR obtained in the ED with mild vascular congestion however without focal infiltrates.CT Abd/Pelvis showed RLL consolidation concerning for pneumonia as well as ground glass densities in the LLL. Has previously received pneumococcal vaccine per last PCP note. patient's mother states that he has occasional cough. No abnormal breath sounds on exam, but patient was not able to cooperate with full breaths. - continue azithromycin (10/20-) - monitor vital signs  Constipation  Abdominal Pain. CT Abd/Pelvis obtained showing large stool burden in the colon concerning for impaction with gaseous distention without evidence of obstruction. Manual disimpaction attempted at home as well as in the ED with minimal success.Enema yesterday had moderate success per mom. Still no spontaneous BM. Abdomen feels firm in lower mid region. Mother states it is improved from yesterday. Last real BM was 4 days ago. 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 Takes Valium 5mg  once daily PRN for muscle spasms.  - continue PRN Valium - hand mitts to prevent pulling out of IV per moms recommendation  Nephrogenic Diabetes insipidus Na 134 on admission. 135 today. Decreased maintenance fluids to 53mL/hr. I did not notice any swelling but mother states his face looks more swollen than at baseline. - repeat BMP am - continue LR 62mL/hr  Epilepsy  Followed by [facility] neurology, Dr. Malvin Johns. H/o seizures since birth, home depakote ER 1500mg  and keppra XR 3000mg  nightly. EEG recently obtained 10/11 and seizure at that week. Mother will be bringing in home meds to administer to prevent seizures since our hospital does not have correct formulation.  - continue home Keppra and Depakote - seizure precautions  Back pain Severe thoracolumbar scoliosis with h/o several back surgeries. Takes Tylenol #3 TID PRN for pain - continue home Tylenol #3  FEN/GI: Heart Healthy Prophylaxis: lovenox  Disposition: med surg for antibiotic treatment  Subjective:  Overnight: no acute events  Today: patient does not appear to be in acute distress. Mother states that he slept well overnight and is eating/drinking well.  Objective: Temp:  [97.5 F (36.4 C)-100.5 F (38.1 C)] 98.4 F (36.9 C) (10/21 1407) Pulse Rate:  [100-152] 108 (10/21 1407) Resp:  [16-27] 16 (10/21 0620) BP: (89-136)/(54-102) 104/60 (10/21 1407) SpO2:  [90 %-100 %] 97 % (10/21 1407) Weight:  [47.8 kg] 47.8 kg (10/20 1842) Physical Exam: General: lying in bed comfortably Cardiovascular: RRR, no murmur appreciated, good cap refill Respiratory: clear upper lobes, diminished lower  breath sounds, did not appreciate any wheezing or rhonchi Abdomen: soft upper, firm lower mid. Patient did not appear to be in pain with palpation, active bowel sounds difusely Extremities: no edema, atrophied  limbs  Laboratory: Recent Labs  Lab 01/24/18 1841 01/24/18 1900 01/25/18 0220  WBC 20.3*  --  14.9*  HGB 15.9 16.7 11.6*  HCT 47.8 49.0 34.9*  PLT 146*  --  119*   Recent Labs  Lab 01/24/18 1841 01/24/18 1900 01/25/18 0220  NA 134* 136 135  K 3.8 3.9 3.4*  CL 98 98 103  CO2 25  --  26  BUN 8 9 9   CREATININE 0.86 0.80 0.70  CALCIUM 9.5  --  8.1*  PROT 7.2  --   --   BILITOT 1.0  --   --   ALKPHOS 59  --   --   ALT 19  --   --   AST 26  --   --   GLUCOSE 131* 131* 112*   Imaging/Diagnostic Tests: none  Leeroy Bock, DO 01/25/2018, 4:51 PM PGY-1, [facility] Family Medicine FPTS Intern pager: [phone], text pages welcome

## 2018-01-26 DIAGNOSIS — J181 Lobar pneumonia, unspecified organism: Secondary | ICD-10-CM

## 2018-01-26 DIAGNOSIS — R7881 Bacteremia: Secondary | ICD-10-CM

## 2018-01-26 DIAGNOSIS — J189 Pneumonia, unspecified organism: Secondary | ICD-10-CM

## 2018-01-26 LAB — CBC WITH DIFFERENTIAL/PLATELET
ABS IMMATURE GRANULOCYTES: 0.03 10*3/uL (ref 0.00–0.07)
BASOS PCT: 0 %
Basophils Absolute: 0 10*3/uL (ref 0.0–0.1)
EOS ABS: 0 10*3/uL (ref 0.0–0.5)
EOS PCT: 0 %
HCT: 34.3 % — ABNORMAL LOW (ref 39.0–52.0)
Hemoglobin: 11.4 g/dL — ABNORMAL LOW (ref 13.0–17.0)
Immature Granulocytes: 1 %
Lymphocytes Relative: 8 %
Lymphs Abs: 0.4 10*3/uL — ABNORMAL LOW (ref 0.7–4.0)
MCH: 31.5 pg (ref 26.0–34.0)
MCHC: 33.2 g/dL (ref 30.0–36.0)
MCV: 94.8 fL (ref 80.0–100.0)
MONO ABS: 0.6 10*3/uL (ref 0.1–1.0)
Monocytes Relative: 11 %
NRBC: 0 % (ref 0.0–0.2)
Neutro Abs: 4.1 10*3/uL (ref 1.7–7.7)
Neutrophils Relative %: 80 %
PLATELETS: 98 10*3/uL — AB (ref 150–400)
RBC: 3.62 MIL/uL — AB (ref 4.22–5.81)
RDW: 13.6 % (ref 11.5–15.5)
WBC: 5.2 10*3/uL (ref 4.0–10.5)

## 2018-01-26 LAB — BASIC METABOLIC PANEL
Anion gap: 11 (ref 5–15)
BUN: 6 mg/dL (ref 6–20)
CO2: 26 mmol/L (ref 22–32)
Calcium: 8.5 mg/dL — ABNORMAL LOW (ref 8.9–10.3)
Chloride: 96 mmol/L — ABNORMAL LOW (ref 98–111)
Creatinine, Ser: 0.59 mg/dL — ABNORMAL LOW (ref 0.61–1.24)
GFR calc Af Amer: >60 mL/min (ref >60)
GFR calc non Af Amer: >60 mL/min (ref >60)
GLUCOSE: 87 mg/dL (ref 70–99)
POTASSIUM: 3.3 mmol/L — AB (ref 3.5–5.1)
SODIUM: 133 mmol/L — AB (ref 135–145)

## 2018-01-26 MED ORDER — SORBITOL 70 % SOLN
960.0000 mL | TOPICAL_OIL | Freq: Once | ORAL | Status: AC
  Administered 2018-01-26: 960 mL via RECTAL
  Filled 2018-01-26: qty 473

## 2018-01-26 MED ORDER — POTASSIUM CHLORIDE CRYS ER 20 MEQ PO TBCR
40.0000 meq | EXTENDED_RELEASE_TABLET | Freq: Once | ORAL | Status: AC
  Administered 2018-01-26: 40 meq via ORAL
  Filled 2018-01-26: qty 2

## 2018-01-26 MED ORDER — AZITHROMYCIN 250 MG PO TABS
500.0000 mg | ORAL_TABLET | Freq: Every day | ORAL | Status: AC
  Administered 2018-01-26 – 2018-01-27 (×2): 500 mg via ORAL
  Filled 2018-01-26 (×2): qty 2

## 2018-01-26 NOTE — Progress Notes (Signed)
Paged by nurse as patient's mother with many questions and stating that she had not seen a doctor today.  Mother concerned that patient continues to not eat and is grimacing with palpation of his b/l upper abdomen and with what she believes is urination.  She also notes that he appears "more swollen" in his face.  Physical Exam: Blood pressure 101/75, pulse (!) 101, temperature 98.8 F (37.1 C), temperature source Axillary, resp. rate 18, height 5' (1.524 m), weight 47.8 kg, SpO2 97 %. Abdomen: Soft, no grimacing with palpation of abdomen, mildly distended Extremities: No edema  A/P: Updated mother on the current plan Will decrease IVF to 25 cc/hr Changed diet to regular per mother's request Reassured mother that patient's appetite may take some time to improve Given improvement in labs and lack of fever, from an infectious standpoint he is greatly improved and cultures are pending Based on imaging at admission and his known large stool burden, I doubt that his stool burden has been completely resolved.  I suspect that his grimacing may be 2/2 further constipation and mother agrees to retry SMOG enema tonight Pain to deep palpation of upper abdomen could also be 2/2 to referred pain from his pneumonia Mother advised that if she misses the doctor in the AM, to let the nurse know that she would like to speak to them Of note, patient's lab show drop in Hgb from 15.9 on 10/20 to 11.6 on 10/21 and 11.4 on 10/22.  This may be dilutional and reassured as it is stable.  Should continue to be monitored.  Luis Abed, D.O.  PGY-1 Family Medicine  01/26/2018 9:46 PM

## 2018-01-26 NOTE — Plan of Care (Signed)

## 2018-01-26 NOTE — Progress Notes (Signed)
Family Medicine Teaching Service Daily Progress Note Intern Pager: [phone]  Patient name: Jacob Wilkerson Medical record number: [id number] Date of birth: 1981/01/07 Age: 37 y.o. Gender: male  Primary Care Provider: Lazarus Gowda, MD Consultants: none Code Status: full  Pt Overview and Major Events to Date:  10/20 admitted  Assessment and Plan: Jacob Wilkerson is a 37 y.o. male presenting with fever, bacteremia, pneumonia, and UTI. 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   Bacteremia Blood Cx positive for E coli.BCID positive for Enterobacteriaceae species and E coli.  Susceptibilities pending.  WBC 3.4, afebrile. - cont CTX (10/21- ) - S/p azithro (10/20-10/23) - f/u susceptibilities   UTI-  Urine this AM much improved in appearance, clear and yellow.  Urine Cx pending.  Patient has remained afebrile with WBC 3.4. - f/u urine culture and sensitivities - f/u blood cultures - continue CTX (10/21-) - cont to monitor CBC - monitor I/O  Pneumonia-  CXR negative, but noted on CT scan.  Afebrile with improving WBC 3.4.  Finishing azithromycin today. - d/c azithromycin 500mg  today (10/20-10/23) - monitor vital signs - cont to monitor CBC  Constipation  Abdominal Pain.  Overnight, mother noted patient wincing with deep palpation of b/l upper abdomen and possibly while urinating.  He received a SMOG enema and had good stool output.  Patient slept comfortably overnight.  - miralax BID  - dulcolax daily - repeat SMOG enema PRN  Normocytic Anemia Hgb 16.7>11.6>11.4>12.1.  MCV 94.  Iron 36, TIBC 178.  Mild iron deficiency, otherwise likely some dilutional effect. Would cautiously start iron if worsens, given GI problems. 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 Takes Valium 5mg  once daily PRN for muscle spasms.  - continue PRN Valium - hand mitts to prevent pulling out of IV per moms recommendation  Nephrogenic Diabetes insipidus Na 027>253>664.  LR decreased from 50 to 25cc/hr overnight as mother reported that patient's face appear swollen. - repeat BMP am - continue LR 81mL/hr, will decrease as tolerates PO diet  Epilepsy  Followed by [facility] neurology, Dr. Malvin Johns. H/o seizures since birth, home depakote ER 1500mg  and keppra XR 3000mg  nightly. EEG recently obtained 10/11 and seizure at that week. Mother will be bringing in home meds to administer to prevent seizures since our hospital does not have correct formulation.  - continue home Keppra and Depakote - seizure precautions  Back pain/Hip Pain Severe thoracolumbar scoliosis with h/o several back surgeries. Takes Tylenol #3 TID PRN for pain.  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  His mother states that he has been in more pain over the past year or so and she is requesting hip x-rays while inpatient. - continue home Tylenol #3 - consider b/l hip XR  FEN/GI: Heart Healthy Prophylaxis: lovenox  Disposition: med surg for antibiotic treatment  Subjective:  Overnight, mother noted that he was wincing in pain and tender to palpation of his abdomen.  Mother states that patient had good stool output last PM and has appeared more comfortable.  He was able to sleep comfortably overnight.  Objective: Temp:  [98.1 F (36.7 C)-99 F (37.2 C)] 98.7 F (37.1 C) (10/23 0623) Pulse Rate:  [88-102] 88 (10/23 0623) Resp:  [16-22] 22 (10/23 0623) BP: (100-108)/(63-75)  100/63 (10/23 1610) SpO2:  [96 %-100 %] 96 % (10/23 9604)  Physical Exam:  General: 37 y.o. male in NAD Cardio: RRR no m/r/g Lungs: CTAB, no wheezing, no rhonchi, no crackles Abdomen: Soft, non-distended, no wincing to palpation, positive bowel sounds Skin: warm and  dry Extremities: No edema, atrophied legs   Laboratory: Recent Labs  Lab 01/25/18 0220 01/26/18 0431 01/27/18 0212  WBC 14.9* 5.2 3.4*  HGB 11.6* 11.4* 12.1*  HCT 34.9* 34.3* 35.9*  PLT 119* 98* 110*   Recent Labs  Lab 01/24/18 1841  01/25/18 0220 01/26/18 0431 01/27/18 0212  NA 134*   < > 135 133* 137  K 3.8   < > 3.4* 3.3* 3.5  CL 98   < > 103 96* 99  CO2 25  --  26 26 30   BUN 8   < > 9 6 7   CREATININE 0.86   < > 0.70 0.59* 0.58*  CALCIUM 9.5  --  8.1* 8.5* 8.7*  PROT 7.2  --   --   --   --   BILITOT 1.0  --   --   --   --   ALKPHOS 59  --   --   --   --   ALT 19  --   --   --   --   AST 26  --   --   --   --   GLUCOSE 131*   < > 112* 87 102*   < > = values in this interval not displayed.   Imaging/Diagnostic Tests: none  Unknown Jim, DO 01/27/2018, 7:19 AM PGY-1, [facility] Family Medicine FPTS Intern pager: [phone], text pages welcome

## 2018-01-26 NOTE — Progress Notes (Signed)
Family Medicine Teaching Service Daily Progress Note Intern Pager: [phone]  Patient name: Jacob Wilkerson Medical record number: [id number] Date of birth: October 11, 1980 Age: 37 y.o. Gender: male  Primary Care Provider: Lazarus Gowda, MD Consultants: none Code Status: full  Pt Overview and Major Events to Date:  10/20 admitted  Assessment and Plan:  Fever unknown source- patient remains afebrile today. He does remain tachycardic with HR max 110. LA within normal limits 1.8 yesterday. WBC 5.2 today which improved from 14.9 yesterday. Patient appears to be resting comfortably.  - continue IV CTX and azithromycin (10/20 - ) - am CBC, BMP - strict I&Os - f/u urine, blood cx - vital signs per unit routine - tylenol PRN for pain  UTI- U/A notable for large leuks, large Hb, many bacteria, negative nitrites. urine continues to look amber and have foul smell. Urine color is still dark yellow but appears improved from yesterday. Condom cath off and patient had urine in bed. About 1200cc output yesterday. Urine culture was collected yesterday.  - f/u urine culture and sensitivities - continue CTX (10/21-) - bmp am - monitor I/O  Pneumonia- CXR obtained in the ED with mild vascular congestion however without focal infiltrates.CT Abd/Pelvis showed RLL consolidation concerning for pneumonia as well as ground glass densities in the LLL. Has previously received pneumococcal vaccine per last PCP note. patient's mother states that he has occasional cough. No abnormal breath sounds on exam, but patient was not able to cooperate with full breaths. - continue azithromycin 500mg  (10/20-10/23) - monitor vital signs  Constipation  Abdominal Pain. Patient had 4+ BM yesterday. Did not receive SMOG enema. Abdomen feels firm in lower mid region, non distended. Does not appear to be painful to palpation. - miralax BID  - dulcolax daily  Cerebral Palsy With spastic quadriplegia, wheelchair bound and non  verbal at baseline, care given by mother and father. Takes Valium 5mg  once daily PRN for muscle spasms.  - continue PRN Valium - hand mitts to prevent pulling out of IV per moms recommendation  Nephrogenic Diabetes insipidus Na 134 on admission. 133 today. Decreased maintenance fluids to 28mL/hr yesterday. No lower extremity swelling. - repeat BMP am - continue LR 69mL/hr  Epilepsy  Followed by [facility] neurology, Dr. Malvin Johns. H/o seizures since birth, home depakote ER 1500mg  and keppra XR 3000mg  nightly. EEG recently obtained 10/11 and seizure at that week. Mother will be bringing in home meds to administer to prevent seizures since our hospital does not have correct formulation.  - continue home Keppra and Depakote - seizure precautions  Back pain Severe thoracolumbar scoliosis with h/o several back surgeries. Takes Tylenol #3 TID PRN for pain - continue home Tylenol #3  FEN/GI: Heart Healthy Prophylaxis: lovenox  Disposition: med surg for antibiotic treatment  Subjective:  Overnight: no acute events  Today: patient is sleeping on exam. Does not appear to be in any acute distress  Objective: Temp:  [98.4 F (36.9 C)-100 F (37.8 C)] 98.5 F (36.9 C) (10/22 0551) Pulse Rate:  [89-110] 101 (10/22 0551) Resp:  [16] 16 (10/22 0551) BP: (92-113)/(54-72) 107/72 (10/22 0551) SpO2:  [93 %-97 %] 95 % (10/22 0551) Physical Exam: General: lying in bed comfortably Cardiovascular: RRR, no murmur appreciated, good cap refill Respiratory: positive stertor, no wheezing Abdomen: soft upper, firm lower mid. Patient did not appear to be in pain with palpation, active bowel sounds difusely Extremities: no edema, atrophied limbs  Laboratory: Recent Labs  Lab 01/24/18 1841 01/24/18 1900 01/25/18 0220  01/26/18 0431  WBC 20.3*  --  14.9* 5.2  HGB 15.9 16.7 11.6* 11.4*  HCT 47.8 49.0 34.9* 34.3*  PLT 146*  --  119* 98*   Recent Labs  Lab 01/24/18 1841 01/24/18 1900 01/25/18 0220  01/26/18 0431  NA 134* 136 135 133*  K 3.8 3.9 3.4* 3.3*  CL 98 98 103 96*  CO2 25  --  26 26  BUN 8 9 9 6   CREATININE 0.86 0.80 0.70 0.59*  CALCIUM 9.5  --  8.1* 8.5*  PROT 7.2  --   --   --   BILITOT 1.0  --   --   --   ALKPHOS 59  --   --   --   ALT 19  --   --   --   AST 26  --   --   --   GLUCOSE 131* 131* 112* 87   Imaging/Diagnostic Tests: none  Leeroy Bock, DO 01/26/2018, 9:02 AM PGY-1, [place] [facility] Family Medicine FPTS Intern pager: [phone], text pages welcome

## 2018-01-27 ENCOUNTER — Inpatient Hospital Stay (HOSPITAL_COMMUNITY): Payer: Medicaid Other

## 2018-01-27 DIAGNOSIS — M25551 Pain in right hip: Secondary | ICD-10-CM

## 2018-01-27 DIAGNOSIS — R7881 Bacteremia: Secondary | ICD-10-CM

## 2018-01-27 DIAGNOSIS — M25552 Pain in left hip: Secondary | ICD-10-CM

## 2018-01-27 LAB — CBC
HCT: 35.9 % — ABNORMAL LOW (ref 39.0–52.0)
Hemoglobin: 12.1 g/dL — ABNORMAL LOW (ref 13.0–17.0)
MCH: 31.7 pg (ref 26.0–34.0)
MCHC: 33.7 g/dL (ref 30.0–36.0)
MCV: 94 fL (ref 80.0–100.0)
NRBC: 0 % (ref 0.0–0.2)
PLATELETS: 110 10*3/uL — AB (ref 150–400)
RBC: 3.82 MIL/uL — AB (ref 4.22–5.81)
RDW: 13.8 % (ref 11.5–15.5)
WBC: 3.4 10*3/uL — AB (ref 4.0–10.5)

## 2018-01-27 LAB — BASIC METABOLIC PANEL
ANION GAP: 8 (ref 5–15)
BUN: 7 mg/dL (ref 6–20)
CALCIUM: 8.7 mg/dL — AB (ref 8.9–10.3)
CO2: 30 mmol/L (ref 22–32)
Chloride: 99 mmol/L (ref 98–111)
Creatinine, Ser: 0.58 mg/dL — ABNORMAL LOW (ref 0.61–1.24)
GFR calc non Af Amer: >60 mL/min (ref >60)
GLUCOSE: 102 mg/dL — AB (ref 70–99)
POTASSIUM: 3.5 mmol/L (ref 3.5–5.1)
SODIUM: 137 mmol/L (ref 135–145)

## 2018-01-27 LAB — URINE CULTURE

## 2018-01-27 LAB — RETICULOCYTES
Immature Retic Fract: 5.8 % (ref 2.3–15.9)
RBC.: 3.82 MIL/uL — ABNORMAL LOW (ref 4.22–5.81)
RETIC CT PCT: 0.8 % (ref 0.4–3.1)
Retic Count, Absolute: 31.3 10*3/uL (ref 19.0–186.0)

## 2018-01-27 LAB — CULTURE, BLOOD (ROUTINE X 2): Special Requests: ADEQUATE

## 2018-01-27 LAB — IRON AND TIBC
IRON: 36 ug/dL — AB (ref 45–182)
SATURATION RATIOS: 20 % (ref 17.9–39.5)
TIBC: 178 ug/dL — ABNORMAL LOW (ref 250–450)
UIBC: 142 ug/dL

## 2018-01-27 LAB — FERRITIN: FERRITIN: 329 ng/mL (ref 24–336)

## 2018-01-27 LAB — VITAMIN B12: Vitamin B-12: 756 pg/mL (ref 180–914)

## 2018-01-27 LAB — FOLATE: FOLATE: 17 ng/mL (ref >5.9)

## 2018-01-27 MED ORDER — MUPIROCIN 2 % EX OINT
TOPICAL_OINTMENT | CUTANEOUS | Status: AC
  Filled 2018-01-27: qty 22

## 2018-01-27 MED ORDER — FERROUS SULFATE 325 (65 FE) MG PO TABS: 325.0000 mg | ORAL_TABLET | Freq: Every day | ORAL | Status: DC

## 2018-01-27 MED ORDER — SULFAMETHOXAZOLE-TRIMETHOPRIM 800-160 MG PO TABS
1.0000 | ORAL_TABLET | Freq: Two times a day (BID) | ORAL | Status: DC
  Administered 2018-01-28: 1 via ORAL
  Filled 2018-01-27: qty 1

## 2018-01-28 LAB — BASIC METABOLIC PANEL
ANION GAP: 7 (ref 5–15)
BUN: 6 mg/dL (ref 6–20)
CO2: 29 mmol/L (ref 22–32)
Calcium: 8.8 mg/dL — ABNORMAL LOW (ref 8.9–10.3)
Chloride: 99 mmol/L (ref 98–111)
Creatinine, Ser: 0.61 mg/dL (ref 0.61–1.24)
GFR calc Af Amer: >60 mL/min (ref >60)
GLUCOSE: 105 mg/dL — AB (ref 70–99)
POTASSIUM: 3.5 mmol/L (ref 3.5–5.1)
Sodium: 135 mmol/L (ref 135–145)

## 2018-01-28 LAB — CBC
HEMATOCRIT: 38.3 % — AB (ref 39.0–52.0)
Hemoglobin: 12.6 g/dL — ABNORMAL LOW (ref 13.0–17.0)
MCH: 31 pg (ref 26.0–34.0)
MCHC: 32.9 g/dL (ref 30.0–36.0)
MCV: 94.1 fL (ref 80.0–100.0)
Platelets: 133 10*3/uL — ABNORMAL LOW (ref 150–400)
RBC: 4.07 MIL/uL — AB (ref 4.22–5.81)
RDW: 13.9 % (ref 11.5–15.5)
WBC: 3.9 10*3/uL — ABNORMAL LOW (ref 4.0–10.5)
nRBC: 0 % (ref 0.0–0.2)

## 2018-01-28 MED ORDER — SULFAMETHOXAZOLE-TRIMETHOPRIM 800-160 MG PO TABS: 1.0000 | ORAL_TABLET | Freq: Two times a day (BID) | ORAL | 0 refills | Status: AC

## 2018-01-28 MED ORDER — MAGNESIUM CITRATE PO SOLN: 296.0000 mL | Freq: Once | ORAL | 0 refills | Status: AC

## 2018-01-28 MED ORDER — POLYETHYLENE GLYCOL 3350 17 G PO PACK: 17.0000 g | PACK | Freq: Every day | ORAL | 0 refills | Status: AC

## 2018-01-28 NOTE — Discharge Instructions (Signed)
Please continue taking the bactrim for 6 more days to complete a 10 day total course of antibiotics Recommend continuing miralax and magnesium citrate daily for the next 6 weeks to help continue to clear out constipation Would schedule this daily versus giving as needed

## 2018-01-28 NOTE — Discharge Summary (Signed)
Family Medicine Teaching [facility] Discharge Summary  Patient name: Jacob Wilkerson Medical record number: [id number] Date of birth: Aug 17, 1980 Age: 36 y.o. Gender: male Date of Admission: 01/24/2018  Date of Discharge: 10/24 Admitting Physician: Nestor Ramp, MD  Primary Care Provider: Lazarus Gowda, MD Consultants: none  Indication for Hospitalization: constipation/UTI  Discharge Diagnoses/Problem List:  CP Nephrogenic diabetes insipidus Epilepsy Bacteremia UTI Pneumonia Constipation Hypokalemia Anemia   Disposition: discharge home with continued antibiotic treatment  Discharge Condition: stable, improved  Discharge Exam:  General: no apparent discomfort Heart: RRR, no murmurs appreciated Pulm: CTAB, not able to cooperate with exam for full inhalation Abd: scaphoid, firm non-distended, no apparent tenderness to palpation Extremities: atrophied musculature, multiple healed surgical scars on lower extremities Derm: no rashes or lesions noted Neuro: alert to verbal, non responsive verbal  Brief Hospital Course:  Patient admitted for constipation x4 days, fever and foul smelling urine per caregiver. Patient was febrile on admission with blood cultures positive for e. Coli. Urine and chest xray also showing evidence of infection. Abdominal CT showed severe stool burden in sigmoid and rectum. Patient received azithromycin, ceftriaxone and bowel clean out regimen that resulted in large release of stool and improvement in urine signs and respiratory status. Patient remained stable, afebrile with normal WBC count >48 hours with good oral intake and spontaneous BMs. He was transitioned to oral bactrim and cleared as safe for discharge with close follow up of resolution of symptoms/infection.  Issues for Follow Up:  1. Will need follow up for resolution of infection.  2. Will need follow up for managing chronic constipation to prevent future obstructions.  Significant  Procedures: none  Significant Labs and Imaging:  Recent Labs  Lab 01/26/18 0431 01/27/18 0212 01/28/18 0332  WBC 5.2 3.4* 3.9*  HGB 11.4* 12.1* 12.6*  HCT 34.3* 35.9* 38.3*  PLT 98* 110* 133*   Recent Labs  Lab 01/24/18 1841 01/24/18 1900 01/25/18 0220 01/26/18 0431 01/27/18 0212 01/28/18 0332  NA 134* 136 135 133* 137 135  K 3.8 3.9 3.4* 3.3* 3.5 3.5  CL 98 98 103 96* 99 99  CO2 25  --  26 26 30 29   GLUCOSE 131* 131* 112* 87 102* 105*  BUN 8 9 9 6 7 6   CREATININE 0.86 0.80 0.70 0.59* 0.58* 0.61  CALCIUM 9.5  --  8.1* 8.5* 8.7* 8.8*  ALKPHOS 59  --   --   --   --   --   AST 26  --   --   --   --   --   ALT 19  --   --   --   --   --   ALBUMIN 3.5  --   --   --   --   --     Results/Tests Pending at Time of Discharge: none  Discharge Medications:  Allergies as of 01/28/2018      Reactions   Morphine Anaphylaxis   Topiramate Other (See Comments)   Got into a coma With valium causes and overdose. And coma   Orange (diagnostic) Nausea And Vomiting   Tramadol Other (See Comments)   Can't take due to his seizures SEIZURE DISORDER CAN NOT TAKE PER NEUROLOGIST.       Medication List    TAKE these medications   acetaminophen-codeine 300-30 MG tablet Commonly known as:  TYLENOL #3 Take 1 tablet by mouth every 6 (six) hours as needed. for pain   diazepam 5 MG tablet Commonly  known as:  VALIUM Take 5 mg by mouth every 12 (twelve) hours as needed. for anxiety   divalproex 500 MG 24 hr tablet Commonly known as:  DEPAKOTE ER Take 1,500 mg by mouth at bedtime.   Levetiracetam 750 MG Tb24 Take 3,000 mg by mouth at bedtime.   magnesium citrate solution Take 296 mLs by mouth once for 1 dose.   polyethylene glycol packet Commonly known as:  MIRALAX / GLYCOLAX Take 17 g by mouth daily.   sulfamethoxazole-trimethoprim 800-160 MG tablet Commonly known as:  BACTRIM DS,SEPTRA DS Take 1 tablet by mouth every 12 (twelve) hours for 6 days.       Discharge  Instructions: Please refer to Patient Instructions section of EMR for full details.  Patient was counseled important signs and symptoms that should prompt return to medical care, changes in medications, dietary instructions, activity restrictions, and follow up appointments.   Follow-Up Appointments: follow up with PCP- Lazarus Gowda, MD  Leeroy Bock, DO 01/28/2018, 12:19 PM PGY-1, [facility]

## 2018-01-28 NOTE — Progress Notes (Signed)
Patient discharged to home. Patient's mother verbalizes understanding of all discharge instructions including discharge medications.

## 2018-01-29 LAB — CULTURE, BLOOD (ROUTINE X 2): Culture: NO GROWTH

## 2020-08-25 IMAGING — CT CT ABD-PELV W/ CM
2 of 4 series · 15 of 46 positions shown, 17 images · IV contrast (APPLIED)
Comparison: None.

CLINICAL DATA: Lower abdominal pain.

EXAM:
CT ABDOMEN AND PELVIS WITH CONTRAST
TECHNIQUE: Multidetector CT imaging of the abdomen and pelvis was performed
using the standard protocol following bolus administration of
intravenous contrast.
CONTRAST:  100mL OMNIPAQUE IOHEXOL 300 MG/ML  SOLN

[Series 3: abdomen 5.0 · axial · 0.81mm/px · z∈[-445,-40]mm · 12 of 95 slices shown, 14 images]
[im 7/95  soft-tissue]
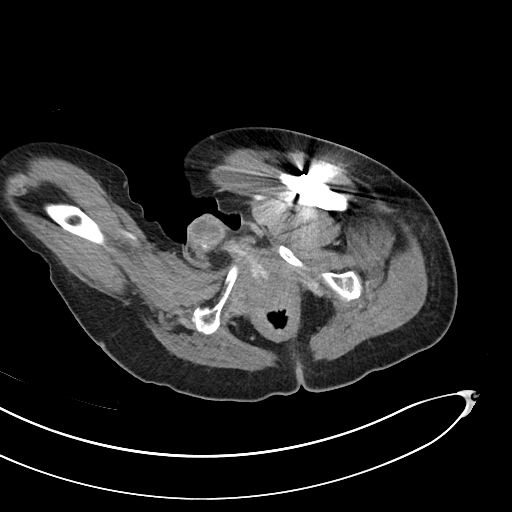
[im 7/95  bone]
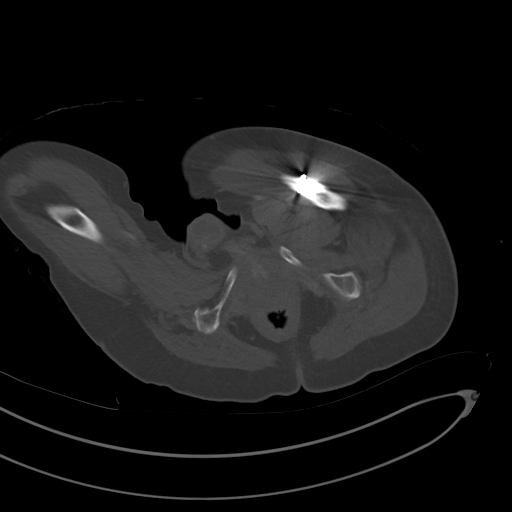
[im 14/95  soft-tissue]
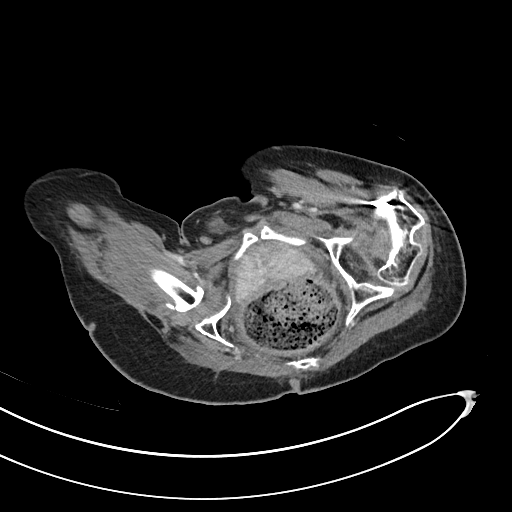
[im 21/95  soft-tissue]
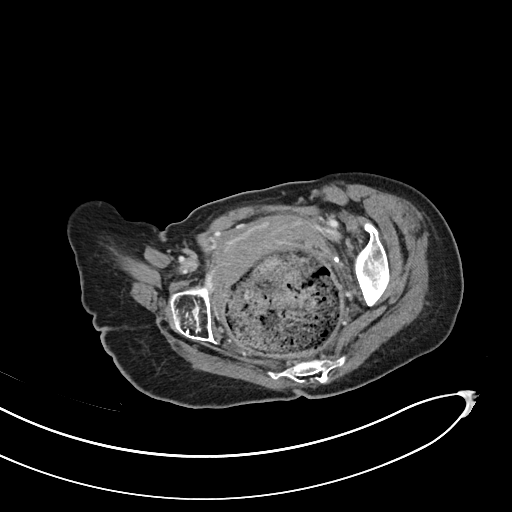
[im 27/95  soft-tissue]
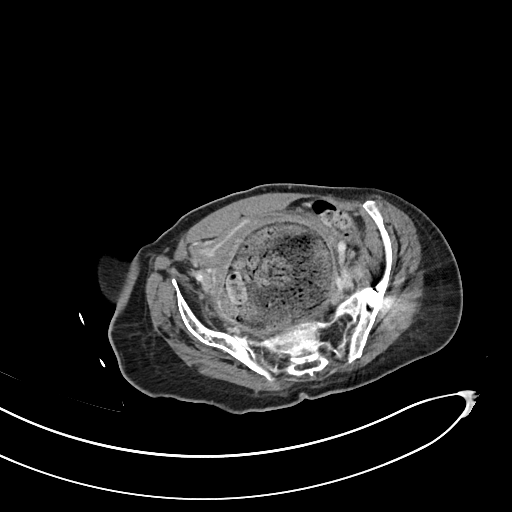
[im 34/95  soft-tissue]
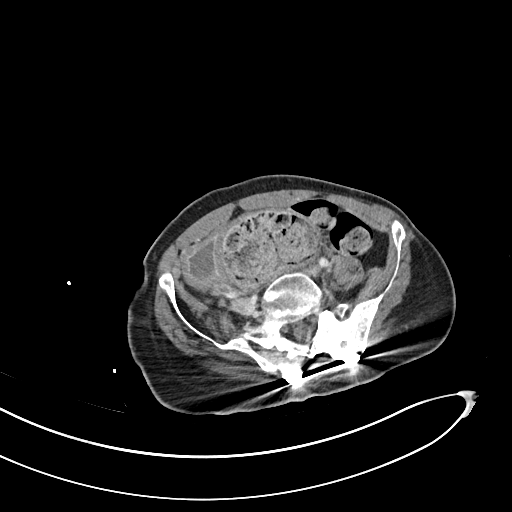
[im 41/95  soft-tissue]
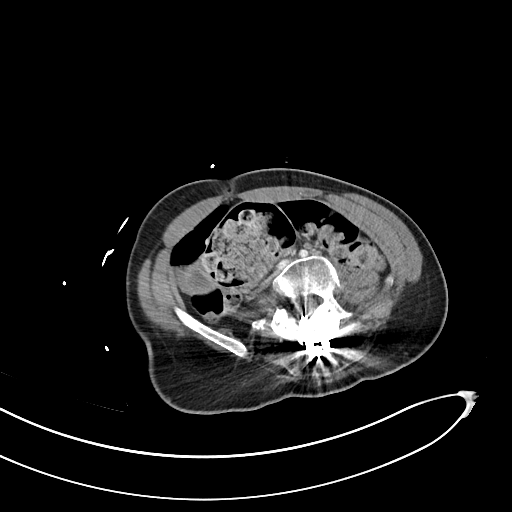
[im 54/95  soft-tissue]
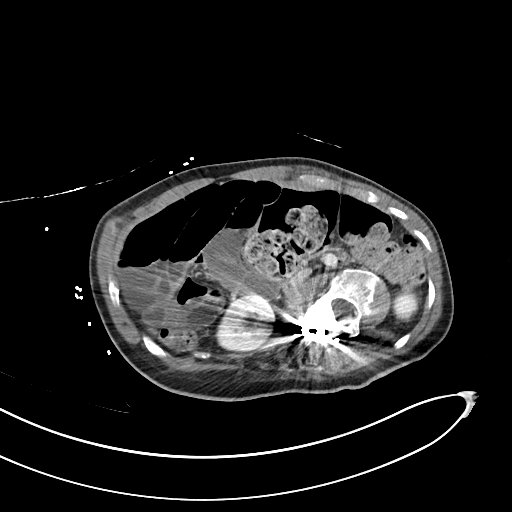
[im 61/95  soft-tissue]
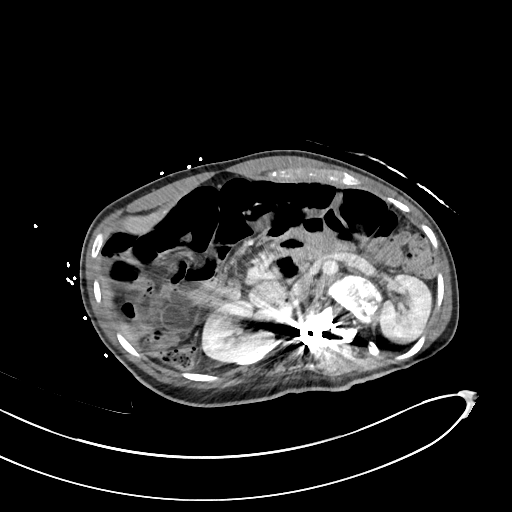
[im 68/95  soft-tissue]
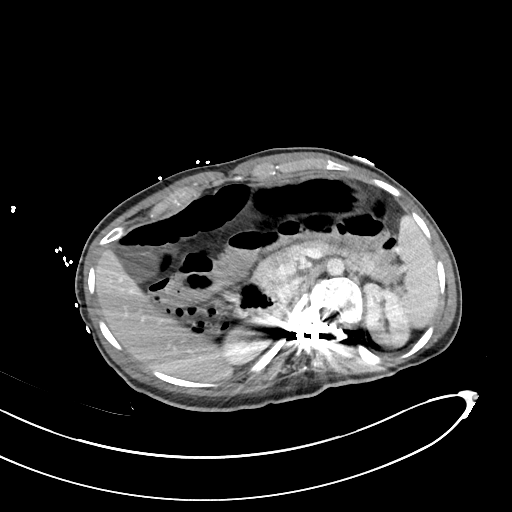
[im 68/95  bone]
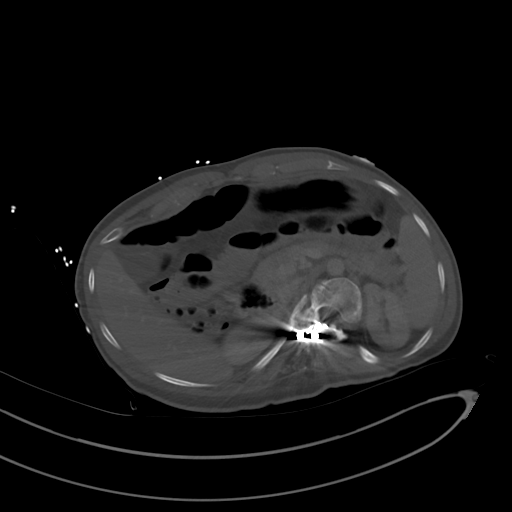
[im 74/95  soft-tissue]
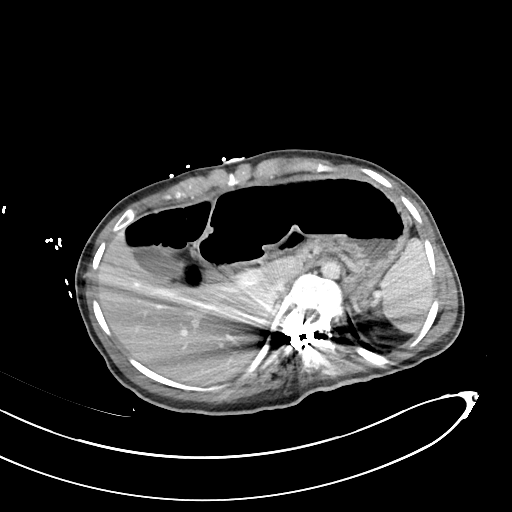
[im 81/95  soft-tissue]
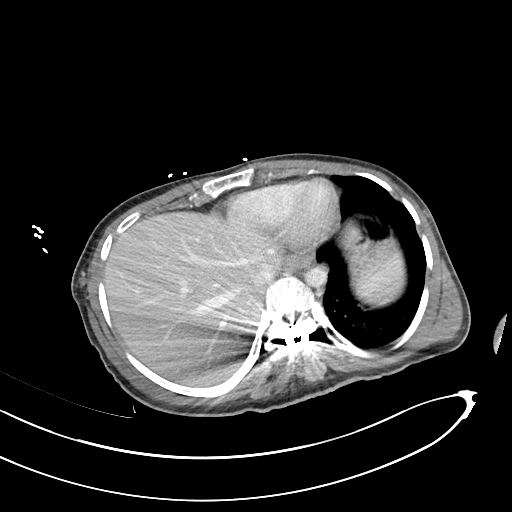
[im 88/95  soft-tissue]
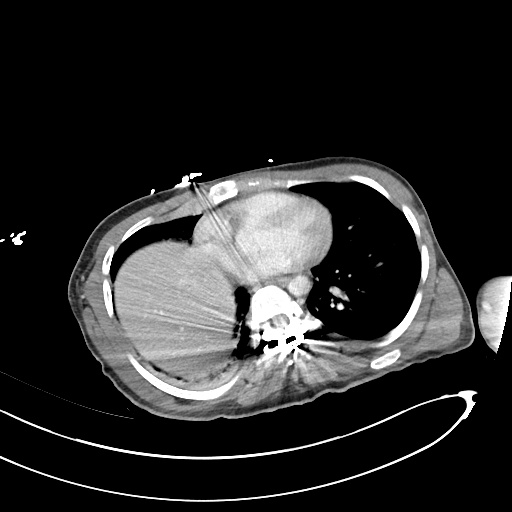

[Series 6: abdomen 3.0 mpr cor · coronal · 0.64mm/px · 3 of 66 slices shown]
[im 22/66  soft-tissue]
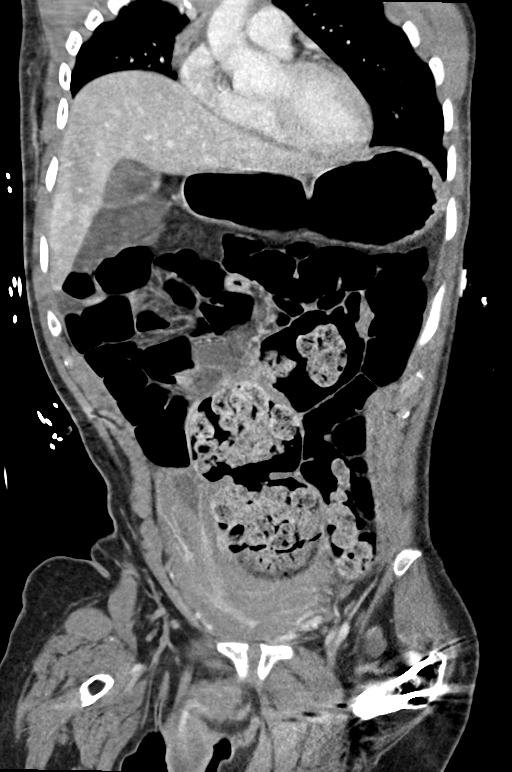
[im 29/66  soft-tissue]
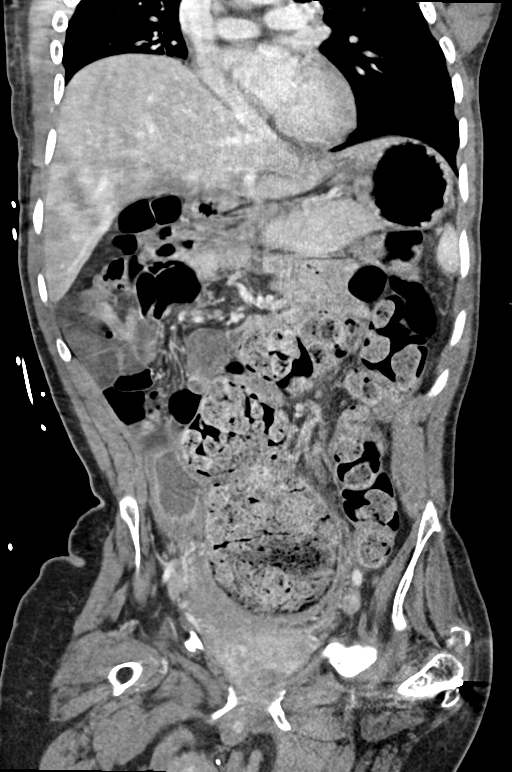
[im 37/66  soft-tissue]
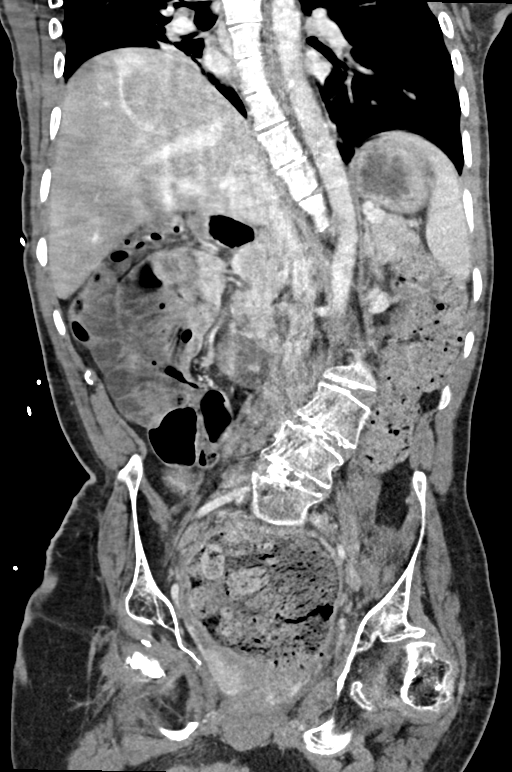

[15 of 46 positions shown; findings below may reference images not displayed]

FINDINGS: Lower chest: Consolidation in the right lower lobe concerning for
pneumonia. Patchy ground-glass opacities in the left lower lobe
could also reflect pneumonia. No effusions.

Hepatobiliary: No focal hepatic abnormality. Gallbladder
unremarkable.

Pancreas: No focal abnormality or ductal dilatation.

Spleen: No focal abnormality.  Normal size.

Adrenals/Urinary Tract: Small cyst in the midpole of the left kidney
and upper pole of the left kidney. No hydronephrosis. No adrenal
mass. Urinary bladder decompressed and difficult to evaluate.

Stomach/Bowel: Large stool burden in the rectosigmoid colon
concerning for fecal impaction. Large stool burden throughout the
remainder of the colon. Gas throughout stomach, large and small
bowel. No convincing evidence for bowel obstruction.

Vascular/Lymphatic: No evidence of aneurysm or adenopathy.

Reproductive: No visible focal abnormality.

Other: No free fluid or free air.

Musculoskeletal: Severe thoracolumbar scoliosis with posterior
spinal rods in place. No acute bony abnormality.
IMPRESSION: Airspace disease in the posterior right lower lobe and to a lesser
extent left lower lobe concerning for pneumonia.

Large stool burden in the colon, particularly rectosigmoid colon
concerning for fecal impaction.

Mild gaseous distention of bowel diffusely without evidence of bowel
obstruction.

Severe thoracolumbar scoliosis.

## 2020-08-25 IMAGING — DX DG CHEST 1V PORT
1 series · 1 of 1 positions shown · non-contrast
Comparison: None

CLINICAL DATA: Cough, hypoxia

EXAM:
PORTABLE CHEST 1 VIEW

[chest]
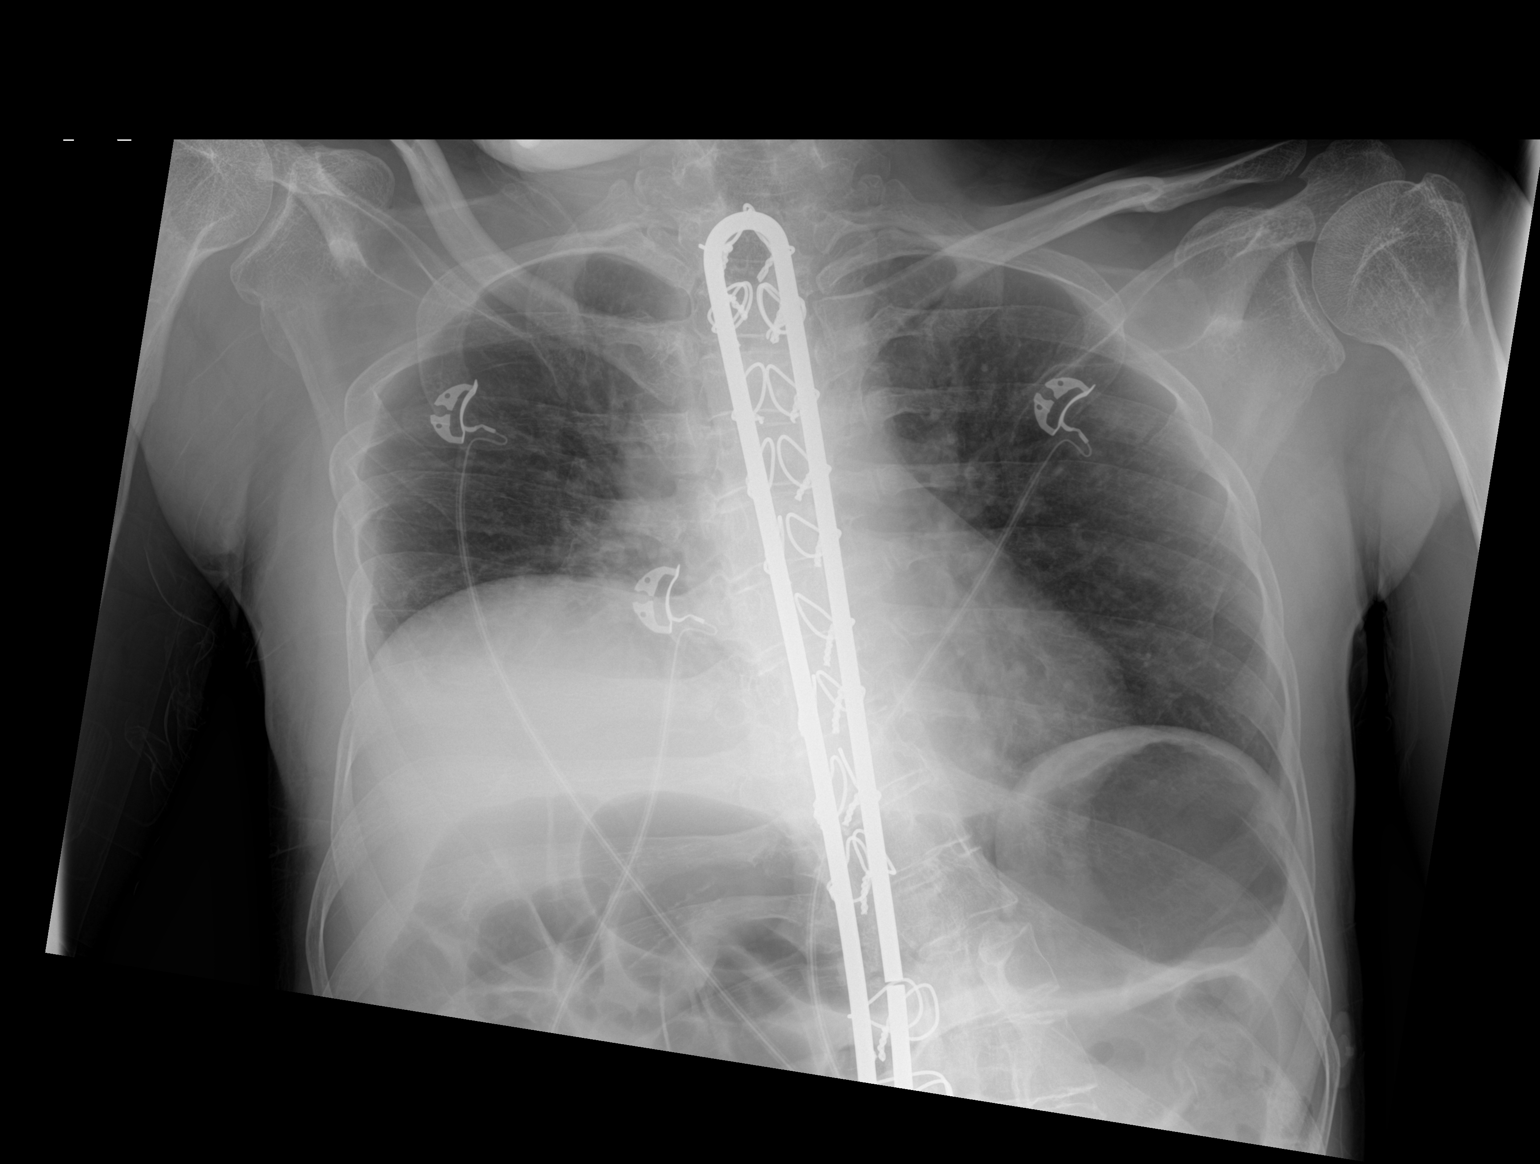

[1 of 1 positions shown; findings below may reference images not displayed]

FINDINGS: Elevation of the right hemidiaphragm. Mild vascular congestion. No
confluent opacities or effusions. Heart is normal size. Posterior
spinal hardware noted. Gaseous distention of upper abdominal bowel.
IMPRESSION: Elevation of the right hemidiaphragm.  Mild vascular congestion.

## 2024-01-26 ENCOUNTER — Other Ambulatory Visit (HOSPITAL_BASED_OUTPATIENT_CLINIC_OR_DEPARTMENT_OTHER): Payer: Self-pay
